# Patient Record
Sex: Female | Born: 1980 | Race: Black or African American | Hispanic: No | Marital: Single | Smoking: Current some day smoker
Health system: Southern US, Community
[De-identification: ages and names within clinical notes are randomized; demographics above are authoritative.]

## PROBLEM LIST (undated history)

## (undated) DIAGNOSIS — J45909 Unspecified asthma, uncomplicated: Secondary | ICD-10-CM

## (undated) DIAGNOSIS — Z789 Other specified health status: Secondary | ICD-10-CM

## (undated) HISTORY — PX: NO PAST SURGERIES: SHX2092

---
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
## 2003-07-06 ENCOUNTER — Encounter: Payer: Self-pay | Admitting: Emergency Medicine

## 2003-07-06 ENCOUNTER — Emergency Department (HOSPITAL_COMMUNITY): Admission: EM | Admit: 2003-07-06 | Discharge: 2003-07-06 | Payer: Self-pay | Admitting: Emergency Medicine

## 2003-12-30 ENCOUNTER — Inpatient Hospital Stay (HOSPITAL_COMMUNITY): Admission: AD | Admit: 2003-12-30 | Discharge: 2003-12-30 | Payer: Self-pay | Admitting: Obstetrics & Gynecology

## 2003-12-31 ENCOUNTER — Inpatient Hospital Stay (HOSPITAL_COMMUNITY): Admission: AD | Admit: 2003-12-31 | Discharge: 2004-01-04 | Payer: Self-pay | Admitting: *Deleted

## 2007-03-12 ENCOUNTER — Inpatient Hospital Stay (HOSPITAL_COMMUNITY): Admission: AD | Admit: 2007-03-12 | Discharge: 2007-03-12 | Payer: Self-pay | Admitting: Obstetrics and Gynecology

## 2007-03-12 ENCOUNTER — Ambulatory Visit: Payer: Self-pay | Admitting: *Deleted

## 2008-03-29 ENCOUNTER — Inpatient Hospital Stay (HOSPITAL_COMMUNITY): Admission: AD | Admit: 2008-03-29 | Discharge: 2008-03-31 | Payer: Self-pay | Admitting: Obstetrics & Gynecology

## 2008-03-29 ENCOUNTER — Ambulatory Visit: Payer: Self-pay | Admitting: Family

## 2008-06-30 ENCOUNTER — Emergency Department (HOSPITAL_COMMUNITY): Admission: EM | Admit: 2008-06-30 | Discharge: 2008-06-30 | Payer: Self-pay | Admitting: Emergency Medicine

## 2008-08-18 ENCOUNTER — Inpatient Hospital Stay (HOSPITAL_COMMUNITY): Admission: AD | Admit: 2008-08-18 | Discharge: 2008-08-18 | Payer: Self-pay | Admitting: Obstetrics & Gynecology

## 2011-04-30 NOTE — Discharge Summary (Signed)
NAME:  Rebekah Evans, Rebekah Evans                         ACCOUNT NO.:  [id number]   MEDICAL RECORD NO.:  [id number]                   PATIENT TYPE:  INP   LOCATION:  9130                                 FACILITY:  [facility]   PHYSICIAN:  Conni Elliot, M.D.             DATE OF BIRTH:  [birth year]   DATE OF ADMISSION:  12/31/2003  DATE OF DISCHARGE:  01/04/2004                                 DISCHARGE SUMMARY   DISCHARGE DIAGNOSES:  1. Pregnancy uterine, delivered, term cephalic live birth.  2. Urinary tract infection.   DISCHARGE MEDICATIONS:  1. Ibuprofen 600 mg p.o. every 6 hours for pain.  2. Macrodantin 100 mg p.o. b.i.d. for 5 more days.  3. Doxycycline 100 mg p.o. b.i.d. for 5 more days.   DISCHARGE INSTRUCTIONS:  1. Diet:  Regular.  2. Activity:  Nothing in the vagina for 6 weeks.  3. Follow up at [facility] after 6 weeks.   REVIEW OF HISTORY:  This is a 30 year old G 2, P 1-0-0-1 who presented at 39  weeks and 1 day because of ruptured bag of waters associated with  contractions.  The patient had stable vital signs and a blood pressure of  132/73, temperature of 97.3, pulse rate of 85, respiratory rate of 20.  Essentially normal physical exam findings.  On speculum examination, there  was pooling of amniotic fluid in the posterior vagina.  Nitrazine test was  positive.  The cervix was 4 cm dilated, 75% effaced, station minus 2.  No  bag of waters palpated.  Vertex presentation.  Fetal heart rate tracing  showed the baseline rate of 125 to 130 with average variability with  accelerations and an occasional variable deceleration.  Her contractions  were coming every 6 minutes, 50 seconds duration, moderate intensity.   HOSPITAL COURSE:  The patient delivered a live baby boy with Apgar score of  9/9 at 3:05 p.m.  Placenta was delivered at 3:10 p.m.  There was note of  second-degree laceration at the posterior vaginal introitus which was  repaired with 3-0 Vicryl.  Estimated blood  loss of 500 mL.  The patient's  postpartum course was complicated by fever on the first postpartum day as  high as 101.1.  Laboratory workup was done to determine the focus of  infection.  Urine cultures showed more than 100,000 colonies of E. coli.  The patient was started on Macrodantin 100 mg p.o. two times a day and  doxycycline for possible endometritis.  The patient was further observed and  became afebrile more than 24 hours prior to discharge.   CONDITION ON DISCHARGE:  The patient was discharged with no complaints.  The  patient had been afebrile for more than 24 hours.  Blood pressure was  112/72, pulse rate of 78, respiratory rate of 20, temperature was 99.3.  The  uterus was well contracted at the level just below the umbilicus.  There  were no nodes or uterine tenderness.  The patient is advised to continue her  antibiotics at home and to follow up in 6 weeks at [facility].     Lawerance Sabal, MD                         Conni Elliot, M.D.   MC/MEDQ  D:  01/04/2004  T:  01/04/2004  Job:  [id number]

## 2011-09-07 LAB — HEPATITIS B SURFACE ANTIGEN: Hepatitis B Surface Ag: NEGATIVE

## 2011-09-07 LAB — CBC
Hemoglobin: 12.8
MCHC: 34.2
Platelets: 209
Platelets: 219
RBC: 4.34
RDW: 13.1
RDW: 13.4
WBC: 13.6 — ABNORMAL HIGH

## 2011-09-07 LAB — TYPE AND SCREEN: Antibody Screen: NEGATIVE

## 2011-09-07 LAB — ABO/RH: ABO/RH(D): O POS

## 2011-09-15 LAB — GC/CHLAMYDIA PROBE AMP, GENITAL
Chlamydia, DNA Probe: NEGATIVE
GC Probe Amp, Genital: POSITIVE — AB

## 2011-09-15 LAB — WET PREP, GENITAL
Trich, Wet Prep: NONE SEEN
Yeast Wet Prep HPF POC: NONE SEEN

## 2016-10-07 ENCOUNTER — Encounter (HOSPITAL_COMMUNITY): Payer: Self-pay | Admitting: *Deleted

## 2016-10-07 ENCOUNTER — Inpatient Hospital Stay (HOSPITAL_COMMUNITY)
Admission: AD | Admit: 2016-10-07 | Discharge: 2016-10-07 | Disposition: A | Discharge status: Home / Self Care | Payer: Self-pay | Source: Ambulatory Visit | Attending: Obstetrics & Gynecology | Admitting: Obstetrics & Gynecology

## 2016-10-07 DIAGNOSIS — O200 Threatened abortion: Secondary | ICD-10-CM

## 2016-10-07 DIAGNOSIS — O209 Hemorrhage in early pregnancy, unspecified: Secondary | ICD-10-CM

## 2016-10-07 DIAGNOSIS — O039 Complete or unspecified spontaneous abortion without complication: Secondary | ICD-10-CM | POA: Insufficient documentation

## 2016-10-07 DIAGNOSIS — F17210 Nicotine dependence, cigarettes, uncomplicated: Secondary | ICD-10-CM | POA: Insufficient documentation

## 2016-10-07 HISTORY — DX: Other specified health status: Z78.9

## 2016-10-07 LAB — URINALYSIS, ROUTINE W REFLEX MICROSCOPIC
BILIRUBIN URINE: NEGATIVE
Glucose, UA: NEGATIVE mg/dL
KETONES UR: NEGATIVE mg/dL
LEUKOCYTES UA: NEGATIVE
NITRITE: NEGATIVE
PH: 6 (ref 5.0–8.0)
PROTEIN: NEGATIVE mg/dL
Specific Gravity, Urine: 1.02 (ref 1.005–1.030)

## 2016-10-07 LAB — CBC WITH DIFFERENTIAL/PLATELET
BASOS ABS: 0 10*3/uL (ref 0.0–0.1)
Basophils Relative: 1 %
EOS PCT: 2 %
Eosinophils Absolute: 0.2 10*3/uL (ref 0.0–0.7)
HEMATOCRIT: 38.2 % (ref 36.0–46.0)
Hemoglobin: 12.7 g/dL (ref 12.0–15.0)
LYMPHS ABS: 3.3 10*3/uL (ref 0.7–4.0)
LYMPHS PCT: 38 %
MCH: 28.4 pg (ref 26.0–34.0)
MCHC: 33.2 g/dL (ref 30.0–36.0)
MCV: 85.5 fL (ref 78.0–100.0)
MONO ABS: 0.4 10*3/uL (ref 0.1–1.0)
MONOS PCT: 5 %
NEUTROS ABS: 4.8 10*3/uL (ref 1.7–7.7)
Neutrophils Relative %: 54 %
PLATELETS: 307 10*3/uL (ref 150–400)
RBC: 4.47 MIL/uL (ref 3.87–5.11)
RDW: 14.8 % (ref 11.5–15.5)
WBC: 8.8 10*3/uL (ref 4.0–10.5)

## 2016-10-07 LAB — WET PREP, GENITAL
Sperm: NONE SEEN
Trich, Wet Prep: NONE SEEN
Yeast Wet Prep HPF POC: NONE SEEN

## 2016-10-07 LAB — URINE MICROSCOPIC-ADD ON
Bacteria, UA: NONE SEEN
WBC UA: NONE SEEN WBC/hpf (ref 0–5)

## 2016-10-07 LAB — POCT PREGNANCY, URINE: PREG TEST UR: NEGATIVE

## 2016-10-07 LAB — HCG, QUANTITATIVE, PREGNANCY: HCG, BETA CHAIN, QUANT, S: 25 m[IU]/mL — AB (ref <5)

## 2016-10-07 MED ORDER — KETOROLAC TROMETHAMINE 60 MG/2ML IM SOLN
60.0000 mg | Freq: Once | INTRAMUSCULAR | Status: AC
  Administered 2016-10-07: 60 mg via INTRAMUSCULAR
  Filled 2016-10-07: qty 2

## 2016-10-07 MED ORDER — OXYCODONE-ACETAMINOPHEN 5-325 MG PO TABS: 1.0000 | ORAL_TABLET | Freq: Four times a day (QID) | ORAL | 0 refills | Status: DC | PRN

## 2016-10-07 NOTE — MAU Provider Note (Signed)
Chief Complaint: Vaginal Bleeding and Abdominal Cramping   First Provider Initiated Contact with Patient 10/07/16 1621        SUBJECTIVE HPI: Rebekah Evans is a 35 y.o. G5P0 at Unknown by LMP who presents to maternity admissions reporting heavy bleeding about 5-6 days ago. Had a positive UPT 3 weeks ago.  Does not want to be pregnant, states it would be terrible. She denies vaginal itching/burning, urinary symptoms, h/a, dizziness, n/v, or fever/chills.    Vaginal Bleeding  The patient's primary symptoms include pelvic pain and vaginal bleeding. The patient's pertinent negatives include no genital itching, genital lesions or genital odor. This is a recurrent problem. The current episode started in the past 7 days. The problem occurs intermittently. The problem has been waxing and waning. The pain is moderate. The problem affects both sides. She is pregnant. Associated symptoms include abdominal pain. Pertinent negatives include no back pain, chills, constipation, diarrhea, dysuria, fever, nausea or vomiting. The vaginal discharge was bloody. The vaginal bleeding is lighter than menses. She has been passing clots. She has not been passing tissue. Nothing aggravates the symptoms. She has tried nothing for the symptoms.   RN Note: Pt reports she has had Positive UPT 3 weeks ago. Started having some cramping on Sunday and then started  Having vaginal bleeding on Monday. Still bleeding tosy reports having some large clots come out.  Past Medical History:  Diagnosis Date  . Medical history non-contributory    Past Surgical History:  Procedure Laterality Date  . NO PAST SURGERIES     Social History   Social History  . Marital status: Single    Spouse name: N/A  . Number of children: N/A  . Years of education: N/A   Occupational History  . Not on file.   Social History Main Topics  . Smoking status: Current Every Day Smoker    Packs/day: 0.50  . Smokeless tobacco: Current User  .  Alcohol use Yes     Comment: Social  . Drug use: No  . Sexual activity: Yes   Other Topics Concern  . Not on file   Social History Narrative  . No narrative on file   No current facility-administered medications on file prior to encounter.    No current outpatient prescriptions on file prior to encounter.   Allergies not on file  I have reviewed patient's Past Medical Hx, Surgical Hx, Family Hx, Social Hx, medications and allergies.   ROS:  Review of Systems  Constitutional: Negative for chills and fever.  Gastrointestinal: Positive for abdominal pain. Negative for constipation, diarrhea, nausea and vomiting.  Genitourinary: Positive for pelvic pain and vaginal bleeding. Negative for dysuria.  Musculoskeletal: Negative for back pain.  Neurological: Negative for dizziness.   Review of Systems  Other systems negative   Physical Exam  Patient Vitals for the past 24 hrs:  BP Temp Temp src Pulse Resp Height Weight  10/07/16 1451 129/84 98.3 F (36.8 C) Oral 85 18 5\' 8"  (1.727 m) (!) 306 lb (138.8 kg)    Physical Exam  Constitutional: Well-developed, well-nourished female in no acute distress.  Cardiovascular: normal rate Respiratory: normal effort GI: Abd soft, non-tender. Pos BS x 4 MS: Extremities nontender, no edema, normal ROM Neurologic: Alert and oriented x 4.  GU: Neg CVAT.  PELVIC EXAM: Cervix pink, visually closed, without lesion, scant bloody discharge, vaginal walls and external genitalia normal Bimanual exam: Cervix 0/long/high, firm, anterior, neg CMT, uterus tender, nonenlarged, adnexa without tenderness, enlargement,  or mass   LAB RESULTS Results for orders placed or performed during the hospital encounter of 10/07/16 (from the past 24 hour(s))  Urinalysis, Routine w reflex microscopic (not at [facility])     Status: Abnormal   Collection Time: 10/07/16  2:55 PM  Result Value Ref Range   Color, Urine YELLOW YELLOW   APPearance HAZY (A) CLEAR   Specific  Gravity, Urine 1.020 1.005 - 1.030   pH 6.0 5.0 - 8.0   Glucose, UA NEGATIVE NEGATIVE mg/dL   Hgb urine dipstick LARGE (A) NEGATIVE   Bilirubin Urine NEGATIVE NEGATIVE   Ketones, ur NEGATIVE NEGATIVE mg/dL   Protein, ur NEGATIVE NEGATIVE mg/dL   Nitrite NEGATIVE NEGATIVE   Leukocytes, UA NEGATIVE NEGATIVE  Urine microscopic-add on     Status: Abnormal   Collection Time: 10/07/16  2:55 PM  Result Value Ref Range   Squamous Epithelial / LPF 0-5 (A) NONE SEEN   WBC, UA NONE SEEN 0 - 5 WBC/hpf   RBC / HPF 6-30 0 - 5 RBC/hpf   Bacteria, UA NONE SEEN NONE SEEN   Urine-Other MICROSCOPIC EXAM PERFORMED ON UNCONCENTRATED URINE   Pregnancy, urine POC     Status: None   Collection Time: 10/07/16  3:51 PM  Result Value Ref Range   Preg Test, Ur NEGATIVE NEGATIVE  CBC with Differential/Platelet     Status: None   Collection Time: 10/07/16  4:17 PM  Result Value Ref Range   WBC 8.8 4.0 - 10.5 K/uL   RBC 4.47 3.87 - 5.11 MIL/uL   Hemoglobin 12.7 12.0 - 15.0 g/dL   HCT 16.1 09.6 - 04.5 %   MCV 85.5 78.0 - 100.0 fL   MCH 28.4 26.0 - 34.0 pg   MCHC 33.2 30.0 - 36.0 g/dL   RDW 40.9 81.1 - 91.4 %   Platelets 307 150 - 400 K/uL   Neutrophils Relative % 54 %   Neutro Abs 4.8 1.7 - 7.7 K/uL   Lymphocytes Relative 38 %   Lymphs Abs 3.3 0.7 - 4.0 K/uL   Monocytes Relative 5 %   Monocytes Absolute 0.4 0.1 - 1.0 K/uL   Eosinophils Relative 2 %   Eosinophils Absolute 0.2 0.0 - 0.7 K/uL   Basophils Relative 1 %   Basophils Absolute 0.0 0.0 - 0.1 K/uL  hCG, quantitative, pregnancy     Status: Abnormal   Collection Time: 10/07/16  4:17 PM  Result Value Ref Range   hCG, Beta Chain, Quant, S 25 (H) <5 mIU/mL  Wet prep, genital     Status: Abnormal   Collection Time: 10/07/16  4:27 PM  Result Value Ref Range   Yeast Wet Prep HPF POC NONE SEEN NONE SEEN   Trich, Wet Prep NONE SEEN NONE SEEN   Clue Cells Wet Prep HPF POC PRESENT (A) NONE SEEN   WBC, Wet Prep HPF POC FEW (A) NONE SEEN   Sperm NONE  SEEN        IMAGING No results found.  MAU Management/MDM: Ordered usual first trimester r/o ectopic labs.   Pelvic exam and cultures done Will check baseline Ultrasound to rule out ectopic.  Consult Dr Alysia Penna with presentation, exam findings, and results.  Since Quant is only 25 and UPT was positive 3 weeks ago, this is likely the end of the miscarriage.  No need to do an [place] today. Will recheck HCG on Saturday.  If it rises, then do an [place]. .   This bleeding/pain can represent a  normal pregnancy with bleeding, spontaneous abortion or even an ectopic which can be life-threatening.  The process as listed above helps to determine which of these is present.    ASSESSMENT Probable incomplete SAB  PLAN Discharge home Plan to repeat HCG level in 48 hours in MAU Saturday evening  Pt stable at time of discharge. Encouraged to return here or to other Urgent Care/ED if she develops worsening of symptoms, increase in pain, fever, or other concerning symptoms.    Wynelle BourgeoisMarie Horst Ostermiller CNM, MSN Certified Nurse-Midwife 10/07/2016  6:41 PM

## 2016-10-07 NOTE — Discharge Instructions (Signed)
Threatened Miscarriage A threatened miscarriage occurs when you have vaginal bleeding during your first 20 weeks of pregnancy but the pregnancy has not ended. If you have vaginal bleeding during this time, your health care provider will do tests to make sure you are still pregnant. If the tests show you are still pregnant and the developing baby (fetus) inside your womb (uterus) is still growing, your condition is considered a threatened miscarriage. A threatened miscarriage does not mean your pregnancy will end, but it does increase the risk of losing your pregnancy (complete miscarriage). CAUSES  The cause of a threatened miscarriage is usually not known. If you go on to have a complete miscarriage, the most common cause is an abnormal number of chromosomes in the developing baby. Chromosomes are the structures inside cells that hold all your genetic material. Some causes of vaginal bleeding that do not result in miscarriage include:  Having sex.  Having an infection.  Normal hormone changes of pregnancy.  Bleeding that occurs when an egg implants in your uterus. RISK FACTORS Risk factors for bleeding in early pregnancy include:  Obesity.  Smoking.  Drinking excessive amounts of alcohol or caffeine.  Recreational drug use. SIGNS AND SYMPTOMS  Light vaginal bleeding.  Mild abdominal pain or cramps. DIAGNOSIS  If you have bleeding with or without abdominal pain before 20 weeks of pregnancy, your health care provider will do tests to check whether you are still pregnant. One important test involves using sound waves and a computer (ultrasound) to create images of the inside of your uterus. Other tests include an internal exam of your vagina and uterus (pelvic exam) and measurement of your baby's heart rate.  You may be diagnosed with a threatened miscarriage if:  Ultrasound testing shows you are still pregnant.  Your baby's heart rate is strong.  A pelvic exam shows that the  opening between your uterus and your vagina (cervix) is closed.  Your heart rate and blood pressure are stable.  Blood tests confirm you are still pregnant. TREATMENT  No treatments have been shown to prevent a threatened miscarriage from going on to a complete miscarriage. However, the right home care is important.  HOME CARE INSTRUCTIONS   Make sure you keep all your appointments for prenatal care. This is very important.  Get plenty of rest.  Do not have sex or use tampons if you have vaginal bleeding.  Do not douche.  Do not smoke or use recreational drugs.  Do not drink alcohol.  Avoid caffeine. SEEK MEDICAL CARE IF:  You have light vaginal bleeding or spotting while pregnant.  You have abdominal pain or cramping.  You have a fever. SEEK IMMEDIATE MEDICAL CARE IF:  You have heavy vaginal bleeding.  You have blood clots coming from your vagina.  You have severe low back pain or abdominal cramps.  You have fever, chills, and severe abdominal pain. MAKE SURE YOU:  Understand these instructions.  Will watch your condition.  Will get help right away if you are not doing well or get worse.   This information is not intended to replace advice given to you by your health care provider. Make sure you discuss any questions you have with your health care provider.   Document Released: 11/29/2005 Document Revised: 12/04/2013 Document Reviewed: 09/25/2013 Elsevier Interactive Patient Education 2016 Elsevier Inc.  Pelvic Rest Pelvic rest is sometimes recommended for women when:   The placenta is partially or completely covering the opening of the cervix (placenta previa).  is bleeding between the uterine wall and the amniotic sac in the first trimester (subchorionic hemorrhage). °· The cervix begins to open without labor starting (incompetent cervix, cervical insufficiency). °· The labor is too early (preterm labor). °HOME CARE INSTRUCTIONS °· Do not have sexual  intercourse, stimulation, or an orgasm. °· Do not use tampons, douche, or put anything in the vagina. °· Do not lift anything over 10 pounds (4.5 kg). °· Avoid strenuous activity or straining your pelvic muscles. °SEEK MEDICAL CARE IF:  °· You have any vaginal bleeding during pregnancy. Treat this as a potential emergency. °· You have cramping pain felt low in the stomach (stronger than menstrual cramps). °· You notice vaginal discharge (watery, mucus, or bloody). °· You have a low, dull backache. °· There are regular contractions or uterine tightening. °SEEK IMMEDIATE MEDICAL CARE IF: °You have vaginal bleeding and have placenta previa.  °  °This information is not intended to replace advice given to you by your health care provider. Make sure you discuss any questions you have with your health care provider. °  °Document Released: 03/26/2011 Document Revised: 02/21/2012 Document Reviewed: 06/02/2015 °Elsevier Interactive Patient Education ©2016 Elsevier Inc. ° °

## 2016-10-07 NOTE — MAU Note (Signed)
Pt reports she has had Positive UPT 3 weeks ago. Started having some cramping on Sunday and then started  Having vaginal bleeding on Monday. Still bleeding tosy reports having some large clots come out.

## 2016-10-08 LAB — GC/CHLAMYDIA PROBE AMP (~~LOC~~) NOT AT ARMC
CHLAMYDIA, DNA PROBE: NEGATIVE
Neisseria Gonorrhea: NEGATIVE

## 2016-10-09 ENCOUNTER — Inpatient Hospital Stay (HOSPITAL_COMMUNITY)
Admission: AD | Admit: 2016-10-09 | Discharge: 2016-10-09 | Disposition: A | Discharge status: Home / Self Care | Payer: Self-pay | Source: Ambulatory Visit | Attending: Obstetrics & Gynecology | Admitting: Obstetrics & Gynecology

## 2016-10-09 DIAGNOSIS — O039 Complete or unspecified spontaneous abortion without complication: Secondary | ICD-10-CM

## 2016-10-09 DIAGNOSIS — Z3A00 Weeks of gestation of pregnancy not specified: Secondary | ICD-10-CM | POA: Insufficient documentation

## 2016-10-09 DIAGNOSIS — O209 Hemorrhage in early pregnancy, unspecified: Secondary | ICD-10-CM | POA: Insufficient documentation

## 2016-10-09 LAB — HCG, QUANTITATIVE, PREGNANCY: hCG, Beta Chain, Quant, S: 15 m[IU]/mL — ABNORMAL HIGH (ref <5)

## 2016-10-09 NOTE — MAU Provider Note (Signed)
History   Chief Complaint:  Follow-up   Rebekah Evans is  35 y.o. G5P0 Patient's last menstrual period was 08/14/2016.Marland Kitchen. Patient is here for follow up of quantitative HCG and ongoing surveillance of pregnancy status.   She is Unknown weeks gestation and does not want to be pregnant.  (Condom broke)  Since her last visit, the patient is without new complaint.   The patient reports bleeding as less than previously. General ROS - no additional complaints  Her previous Quantitative HCG values are: 25 on 10-07-16.     Physical Exam   Blood pressure 130/65, pulse 90, temperature 98.6 F (37 C), temperature source Oral, resp. rate 18, last menstrual period 08/14/2016.  Focused Gynecological Exam:  GENERAL: Well-developed, well-nourished female in no acute distress.  HEENT: Normocephalic, atraumatic.  LUNGS: Effort normal  HEART: Regular rate  SKIN: Warm, dry and without erythema  PSYCH: Normal mood and affect   Labs: Results for orders placed or performed during the hospital encounter of 10/09/16 (from the past 24 hour(s))  hCG, quantitative, pregnancy   Collection Time: 10/09/16  4:45 PM  Result Value Ref Range   hCG, Beta Chain, Quant, S 15 (H) <5 mIU/mL    Ultrasound Studies:   No results found.  Assessment:  Falling quants consistent with spontaneous AB   Plan: The patient is instructed to follow up in on Monday, November 56 at 11 [facility] To return here if develop severe lower abdominal pain or severe vaginal bleeding.  Rebekah Evans L Rebekah Evans 10/09/2016, 6:05 PM

## 2016-10-09 NOTE — MAU Note (Signed)
Bleeding has gotten heavier, soaking pads. Is not hurting as much. Bleeding increased last night.

## 2016-10-18 ENCOUNTER — Ambulatory Visit: Payer: Self-pay

## 2016-10-18 ENCOUNTER — Telehealth: Payer: Self-pay

## 2016-10-18 NOTE — Telephone Encounter (Signed)
Patient was down for STAT beta today but miss her appointment. Called patient spoke with her mother who stated mom was not available to talk at this time.  Per mother will give her the message to call us back tomorrow to reschedule her appointment.

## 2016-11-18 ENCOUNTER — Encounter (HOSPITAL_COMMUNITY): Payer: Self-pay | Admitting: *Deleted

## 2016-11-18 ENCOUNTER — Emergency Department (HOSPITAL_COMMUNITY)
Admission: EM | Admit: 2016-11-18 | Discharge: 2016-11-18 | Disposition: A | Discharge status: Home / Self Care | Payer: Medicaid Other | Attending: Emergency Medicine | Admitting: Emergency Medicine

## 2016-11-18 DIAGNOSIS — L959 Vasculitis limited to the skin, unspecified: Secondary | ICD-10-CM | POA: Insufficient documentation

## 2016-11-18 DIAGNOSIS — R21 Rash and other nonspecific skin eruption: Secondary | ICD-10-CM | POA: Diagnosis present

## 2016-11-18 DIAGNOSIS — F17200 Nicotine dependence, unspecified, uncomplicated: Secondary | ICD-10-CM | POA: Diagnosis not present

## 2016-11-18 DIAGNOSIS — I776 Arteritis, unspecified: Secondary | ICD-10-CM

## 2016-11-18 LAB — COMPREHENSIVE METABOLIC PANEL
ALBUMIN: 3.3 g/dL — AB (ref 3.5–5.0)
ALK PHOS: 70 U/L (ref 38–126)
ALT: 16 U/L (ref 14–54)
ANION GAP: 7 (ref 5–15)
AST: 20 U/L (ref 15–41)
BILIRUBIN TOTAL: 0.2 mg/dL — AB (ref 0.3–1.2)
BUN: 11 mg/dL (ref 6–20)
CALCIUM: 8.8 mg/dL — AB (ref 8.9–10.3)
CO2: 23 mmol/L (ref 22–32)
CREATININE: 0.93 mg/dL (ref 0.44–1.00)
Chloride: 106 mmol/L (ref 101–111)
GFR calc Af Amer: >60 mL/min (ref >60)
GFR calc non Af Amer: >60 mL/min (ref >60)
GLUCOSE: 104 mg/dL — AB (ref 65–99)
Potassium: 3.5 mmol/L (ref 3.5–5.1)
Sodium: 136 mmol/L (ref 135–145)
TOTAL PROTEIN: 6.6 g/dL (ref 6.5–8.1)

## 2016-11-18 LAB — CBC
HEMATOCRIT: 37.5 % (ref 36.0–46.0)
HEMOGLOBIN: 12.1 g/dL (ref 12.0–15.0)
MCH: 26.9 pg (ref 26.0–34.0)
MCHC: 32.3 g/dL (ref 30.0–36.0)
MCV: 83.3 fL (ref 78.0–100.0)
Platelets: 307 10*3/uL (ref 150–400)
RBC: 4.5 MIL/uL (ref 3.87–5.11)
RDW: 14.6 % (ref 11.5–15.5)
WBC: 9.4 10*3/uL (ref 4.0–10.5)

## 2016-11-18 MED ORDER — PREDNISONE 20 MG PO TABS: 60.0000 mg | ORAL_TABLET | Freq: Every day | ORAL | 0 refills | Status: AC

## 2016-11-18 MED ORDER — OXYCODONE-ACETAMINOPHEN 5-325 MG PO TABS
1.0000 | ORAL_TABLET | Freq: Once | ORAL | Status: AC
  Administered 2016-11-18: 1 via ORAL
  Filled 2016-11-18: qty 1

## 2016-11-18 MED ORDER — PREDNISONE 20 MG PO TABS
60.0000 mg | ORAL_TABLET | Freq: Once | ORAL | Status: AC
  Administered 2016-11-18: 60 mg via ORAL
  Filled 2016-11-18: qty 3

## 2016-11-18 MED ORDER — OXYCODONE-ACETAMINOPHEN 5-325 MG PO TABS: 1.0000 | ORAL_TABLET | Freq: Three times a day (TID) | ORAL | 0 refills | Status: DC | PRN

## 2016-11-18 NOTE — ED Notes (Signed)
Crackers given, pt has not eaten yet today.

## 2016-11-18 NOTE — ED Triage Notes (Signed)
Pt states red, painful swollen rash to both LE.  States intermittent edema to both LE.  Denies sob, chest pain, etc.

## 2016-11-18 NOTE — Discharge Instructions (Signed)
Please read and follow all provided instructions.  Your diagnoses today include:  1. Vasculitis (HCC)    Tests performed today include: Vital signs. See below for your results today.   Medications prescribed:  Take as prescribed   Home care instructions:  Follow any educational materials contained in this packet.  Follow-up instructions: Please follow-up with Rheumatology for further evaluation of symptoms and treatment   Return instructions:  Please return to the Emergency Department if you do not get better, if you get worse, or new symptoms OR  - Fever (temperature greater than 101.19F)  - Bleeding that does not stop with holding pressure to the area    -Severe pain (please note that you may be more sore the day after your accident)  - Chest Pain  - Difficulty breathing  - Severe nausea or vomiting  - Inability to tolerate food and liquids  - Passing out  - Skin becoming red around your wounds  - Change in mental status (confusion or lethargy)  - New numbness or weakness    Please return if you have any other emergent concerns.  Additional Information:  Your vital signs today were: BP 136/77 (BP Location: Left Arm)    Pulse 86    Temp 97.8 F (36.6 C) (Oral)    Resp 19    Ht 5\' 8"  (1.727 m)    Wt 131.5 kg    LMP 10/13/2016    SpO2 96%    BMI 44.09 kg/m  If your blood pressure (BP) was elevated above 135/85 this visit, please have this repeated by your doctor within one month. ---------------

## 2016-11-18 NOTE — ED Notes (Signed)
Pt and child in room. Introduced myself to them and advised them that RN would be in shortly.

## 2016-11-18 NOTE — ED Provider Notes (Signed)
[facility]-EMERGENCY DEPT Provider Note   CSN: [id number] Arrival date & time: 11/18/16  1145  By signing my name below, I, Sonum Patel, attest that this documentation has been prepared under the direction and in the presence of Audry Piliyler Indya Oliveria, PA-C. Electronically Signed: Sonum Patel, Neurosurgeoncribe. 11/18/16. 12:05 PM.  History   Chief Complaint Chief Complaint  Patient presents with  . Leg Swelling  . Rash  . Leg Pain    The history is provided by the patient. No language interpreter was used.     HPI Comments: Rebekah Evans is a 35 y.o. female who presents to the Emergency Department complaining of bruising and pain to the bilateral lower extremities for the past few days. She denies any known trauma to injury to the affected area. She states the bruising appears randomly on both legs and start to fade after 1-2 days. She reports associated swelling to the affected areas. She denies new medications, detergents, or body products. She denies recent long distance travel. She denies fever. No URI symptoms recently. No CP/SOb/ABD pain. No other symptoms noted.   Past Medical History:  Diagnosis Date  . Medical history non-contributory     There are no active problems to display for this patient.   Past Surgical History:  Procedure Laterality Date  . NO PAST SURGERIES      OB History    Gravida Para Term Preterm AB Living   5         5   SAB TAB Ectopic Multiple Live Births                   Home Medications    Prior to Admission medications   Medication Sig Start Date End Date Taking? Authorizing Provider  oxyCODONE-acetaminophen (PERCOCET/ROXICET) 5-325 MG tablet Take 1-2 tablets by mouth every 6 (six) hours as needed. 10/07/16   Aviva SignsMarie L Williams, CNM    Family History No family history on file.  Social History Social History  Substance Use Topics  . Smoking status: Current Every Day Smoker    Packs/day: 0.50  . Smokeless tobacco: Current User  . Alcohol use Yes   Comment: Social    Allergies   Patient has no known allergies.   Review of Systems Review of Systems  Constitutional: Negative for fever.  HENT: Negative for congestion, sinus pain, sinus pressure and sore throat.   Respiratory: Negative for cough and shortness of breath.   Gastrointestinal: Negative for diarrhea, nausea and vomiting.  Skin: Positive for color change.  Allergic/Immunologic: Negative for immunocompromised state.  Neurological: Negative for numbness.  Hematological: Does not bruise/bleed easily.   Physical Exam Updated Vital Signs BP 136/77 (BP Location: Left Arm)   Pulse 86   Temp 97.8 F (36.6 C) (Oral)   Resp 19   Ht 5\' 8"  (1.727 m)   Wt 290 lb (131.5 kg)   LMP 10/13/2016   SpO2 96%   BMI 44.09 kg/m   Physical Exam  Constitutional: She is oriented to person, place, and time. Vital signs are normal. She appears well-developed and well-nourished.  HENT:  Head: Normocephalic and atraumatic.  Right Ear: Hearing normal.  Left Ear: Hearing normal.  Eyes: Conjunctivae and EOM are normal. Pupils are equal, round, and reactive to light.  Neck: Normal range of motion. Neck supple.  Cardiovascular: Normal rate, regular rhythm, normal heart sounds and intact distal pulses.   Pulmonary/Chest: Effort normal and breath sounds normal.  Musculoskeletal: Normal range of motion.  Neurological: She  is alert and oriented to person, place, and time.  Skin: Skin is warm and dry.  BLE with macular rashes noted. Scattered with sized between 3-4cm. No blanching. Areas are faint. No signs of infection. Appears almost ecchymotic with stages of healing noted on several areas. TTP on areas. Otherwise NVI. Motor/sensation intact.   Psychiatric: She has a normal mood and affect. Her speech is normal and behavior is normal. Thought content normal.  Nursing note and vitals reviewed.  ED Treatments / Results  DIAGNOSTIC STUDIES: Oxygen Saturation is 96% on RA, adequate by my  interpretation.    COORDINATION OF CARE: 12:06 PM Discussed treatment plan with pt at bedside and pt agreed to plan.   Labs (all labs ordered are listed, but only abnormal results are displayed) Labs Reviewed - No data to display  EKG  EKG Interpretation None       Radiology No results found.  Procedures Procedures (including critical care time)  Medications Ordered in ED Medications - No data to display   Initial Impression / Assessment and Plan / ED Course  I have reviewed the triage vital signs and the nursing notes.  Pertinent labs & imaging results that were available during my care of the patient were reviewed by me and considered in my medical decision making (see chart for details).  Clinical Course    Final Clinical Impressions(s) / ED Diagnoses     I have reviewed the relevant previous healthcare records.  I obtained HPI from historian. {Patient discussed with supervising physician  ED Course:  Assessment: Pt is a 35yF who presents with BLE rash noted with swelling. Has had swelling in past, but rash recently appeared a few days ago. No fevers. No URI symptoms. No coagulation disorders. On exam, pt in NAD. Nontoxic/nonseptic appearing. VSS. Afebrile. Lungs CTA. Heart RRR. BLE NVI. Motor/sensation intact. Rashes are macular and appear ecchymotic. Stages of bruising noted. No signs of infection. Discussed with supervising physician who has seen and evaluated patient. CBC unremarkable with platelets WNL. CMP with liver function unremarkable. Possible vasculitis. Given course of steroids. Follow up with Rheumatology. Plan is to DC home. At time of discharge, Patient is in no acute distress. Vital Signs are stable. Patient is able to ambulate. Patient able to tolerate PO.   Disposition/Plan:  DC Home Additional Verbal discharge instructions given and discussed with patient.  Pt Instructed to f/u with Rheumatology in the next week for evaluation and treatment of  symptoms. 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 The recorded information has been reviewed and is accurate.    Audry Piliyler Hazen Brumett, PA-C 11/18/16 1404    Gerhard Munchobert Lockwood, MD 11/18/16 1526    Gerhard Munchobert Lockwood, MD 11/18/16 [phone]

## 2018-04-23 ENCOUNTER — Emergency Department (HOSPITAL_COMMUNITY)
Admission: EM | Admit: 2018-04-23 | Discharge: 2018-04-23 | Disposition: A | Discharge status: Home / Self Care | Payer: No Typology Code available for payment source | Attending: Physician Assistant | Admitting: Physician Assistant

## 2018-04-23 DIAGNOSIS — Z23 Encounter for immunization: Secondary | ICD-10-CM | POA: Insufficient documentation

## 2018-04-23 DIAGNOSIS — F17200 Nicotine dependence, unspecified, uncomplicated: Secondary | ICD-10-CM | POA: Insufficient documentation

## 2018-04-23 DIAGNOSIS — T25221A Burn of second degree of right foot, initial encounter: Secondary | ICD-10-CM | POA: Diagnosis present

## 2018-04-23 DIAGNOSIS — Y929 Unspecified place or not applicable: Secondary | ICD-10-CM | POA: Diagnosis not present

## 2018-04-23 DIAGNOSIS — Y939 Activity, unspecified: Secondary | ICD-10-CM | POA: Insufficient documentation

## 2018-04-23 DIAGNOSIS — X101XXA Contact with hot food, initial encounter: Secondary | ICD-10-CM | POA: Diagnosis not present

## 2018-04-23 DIAGNOSIS — Y998 Other external cause status: Secondary | ICD-10-CM | POA: Insufficient documentation

## 2018-04-23 MED ORDER — IBUPROFEN 200 MG PO TABS
600.0000 mg | ORAL_TABLET | Freq: Once | ORAL | Status: AC
  Administered 2018-04-23: 600 mg via ORAL
  Filled 2018-04-23: qty 3

## 2018-04-23 MED ORDER — SILVER SULFADIAZINE 1 % EX CREA
TOPICAL_CREAM | Freq: Once | CUTANEOUS | Status: AC
  Administered 2018-04-23: 1 via TOPICAL
  Filled 2018-04-23: qty 50

## 2018-04-23 MED ORDER — TRAMADOL HCL 50 MG PO TABS
50.0000 mg | ORAL_TABLET | Freq: Once | ORAL | Status: AC
  Administered 2018-04-23: 50 mg via ORAL
  Filled 2018-04-23: qty 1

## 2018-04-23 MED ORDER — TRAMADOL HCL 50 MG PO TABS: 50.0000 mg | ORAL_TABLET | Freq: Four times a day (QID) | ORAL | 0 refills | Status: DC | PRN

## 2018-04-23 MED ORDER — TETANUS-DIPHTH-ACELL PERTUSSIS 5-2.5-18.5 LF-MCG/0.5 IM SUSP
0.5000 mL | Freq: Once | INTRAMUSCULAR | Status: AC
  Administered 2018-04-23: 0.5 mL via INTRAMUSCULAR
  Filled 2018-04-23: qty 0.5

## 2018-04-23 MED ORDER — NAPROXEN 500 MG PO TABS: 500.0000 mg | ORAL_TABLET | Freq: Two times a day (BID) | ORAL | 0 refills | Status: DC

## 2018-04-23 NOTE — ED Notes (Signed)
Bed: WTR9 Expected date:  Expected time:  Means of arrival:  Comments: 
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
## 2018-04-23 NOTE — Discharge Instructions (Signed)
Clean the wound and change the dressing every 12 hours. Follow up with the wound center for further treatment. Return here as needed.
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
## 2018-11-14 ENCOUNTER — Other Ambulatory Visit: Payer: Self-pay

## 2018-11-14 ENCOUNTER — Emergency Department (HOSPITAL_COMMUNITY): Payer: Self-pay

## 2018-11-14 ENCOUNTER — Inpatient Hospital Stay (HOSPITAL_COMMUNITY)
Admission: EM | Admit: 2018-11-14 | Discharge: 2018-11-18 | DRG: 202 | Disposition: A | Discharge status: Home / Self Care | Payer: Self-pay | Attending: Internal Medicine | Admitting: Internal Medicine

## 2018-11-14 ENCOUNTER — Encounter (HOSPITAL_COMMUNITY): Payer: Self-pay | Admitting: Emergency Medicine

## 2018-11-14 DIAGNOSIS — J209 Acute bronchitis, unspecified: Principal | ICD-10-CM | POA: Diagnosis present

## 2018-11-14 DIAGNOSIS — Z720 Tobacco use: Secondary | ICD-10-CM | POA: Diagnosis present

## 2018-11-14 DIAGNOSIS — Z833 Family history of diabetes mellitus: Secondary | ICD-10-CM

## 2018-11-14 DIAGNOSIS — Z79891 Long term (current) use of opiate analgesic: Secondary | ICD-10-CM

## 2018-11-14 DIAGNOSIS — F1290 Cannabis use, unspecified, uncomplicated: Secondary | ICD-10-CM | POA: Diagnosis present

## 2018-11-14 DIAGNOSIS — Z713 Dietary counseling and surveillance: Secondary | ICD-10-CM

## 2018-11-14 DIAGNOSIS — J40 Bronchitis, not specified as acute or chronic: Secondary | ICD-10-CM

## 2018-11-14 DIAGNOSIS — R0602 Shortness of breath: Secondary | ICD-10-CM

## 2018-11-14 DIAGNOSIS — Z6841 Body Mass Index (BMI) 40.0 and over, adult: Secondary | ICD-10-CM

## 2018-11-14 DIAGNOSIS — Z716 Tobacco abuse counseling: Secondary | ICD-10-CM

## 2018-11-14 DIAGNOSIS — J45901 Unspecified asthma with (acute) exacerbation: Secondary | ICD-10-CM | POA: Diagnosis present

## 2018-11-14 DIAGNOSIS — R739 Hyperglycemia, unspecified: Secondary | ICD-10-CM | POA: Diagnosis present

## 2018-11-14 DIAGNOSIS — F17210 Nicotine dependence, cigarettes, uncomplicated: Secondary | ICD-10-CM | POA: Diagnosis present

## 2018-11-14 DIAGNOSIS — R0902 Hypoxemia: Secondary | ICD-10-CM | POA: Diagnosis present

## 2018-11-14 DIAGNOSIS — Z8709 Personal history of other diseases of the respiratory system: Secondary | ICD-10-CM

## 2018-11-14 DIAGNOSIS — F1010 Alcohol abuse, uncomplicated: Secondary | ICD-10-CM | POA: Diagnosis present

## 2018-11-14 DIAGNOSIS — Z79899 Other long term (current) drug therapy: Secondary | ICD-10-CM

## 2018-11-14 MED ORDER — PREDNISONE 20 MG PO TABS
60.0000 mg | ORAL_TABLET | Freq: Once | ORAL | Status: AC
  Administered 2018-11-14: 60 mg via ORAL
  Filled 2018-11-14: qty 3

## 2018-11-14 MED ORDER — IPRATROPIUM-ALBUTEROL 0.5-2.5 (3) MG/3ML IN SOLN
3.0000 mL | Freq: Once | RESPIRATORY_TRACT | Status: AC
  Administered 2018-11-14: 3 mL via RESPIRATORY_TRACT
  Filled 2018-11-14: qty 3

## 2018-11-14 MED ORDER — ALBUTEROL SULFATE (2.5 MG/3ML) 0.083% IN NEBU
5.0000 mg | INHALATION_SOLUTION | Freq: Once | RESPIRATORY_TRACT | Status: AC
  Administered 2018-11-14: 5 mg via RESPIRATORY_TRACT
  Filled 2018-11-14: qty 6

## 2018-11-14 NOTE — ED Triage Notes (Signed)
Patient c/o cough, shortness of breath and body cramps since last night. Wheezing noted in triage. Patient reports she is anxious. Also c/o intermittent central chest pain with cough.

## 2018-11-14 NOTE — ED Provider Notes (Signed)
[facility]-EMERGENCY DEPT Provider Note   CSN: [id number] Arrival date & time: 11/14/18  2024     History   Chief Complaint Chief Complaint  Patient presents with  . Shortness of Breath  . Cough    HPI Rebekah Evans is a 37 y.o. female.  The history is provided by the patient.  Shortness of Breath  Associated symptoms include cough.  Cough  Associated symptoms include shortness of breath.  She comes in with onset last night of cough and difficulty breathing.  Cough is nonproductive.  When she goes into a coughing spasm, she gets cramps in her sides which seem to spread to her whole body.  She denies fever but has had chills and sweats.  Nothing makes symptoms better, nothing makes them worse.  She denies any sick contacts.  She does smoke 1/4 pack of cigarettes a day and also smokes marijuana.  She did receive an albuterol nebulizer treatment which seems to have given her some slight relief.  Past Medical History:  Diagnosis Date  . Medical history non-contributory     There are no active problems to display for this patient.   Past Surgical History:  Procedure Laterality Date  . NO PAST SURGERIES       OB History    Gravida  5   Para      Term      Preterm      AB      Living  5     SAB      TAB      Ectopic      Multiple      Live Births               Home Medications    Prior to Admission medications   Medication Sig Start Date End Date Taking? Authorizing Provider  naproxen (NAPROSYN) 500 MG tablet Take 1 tablet (500 mg total) by mouth 2 (two) times daily. 04/23/18   Janne Napoleon, NP  oxyCODONE-acetaminophen (PERCOCET/ROXICET) 5-325 MG tablet Take 1 tablet by mouth every 8 (eight) hours as needed for severe pain. 11/18/16   Audry Pili, PA-C  traMADol (ULTRAM) 50 MG tablet Take 1 tablet (50 mg total) by mouth every 6 (six) hours as needed. 04/23/18   Janne Napoleon, NP    Family History No family history on  file.  Social History Social History   Tobacco Use  . Smoking status: Current Every Day Smoker    Packs/day: 0.50  . Smokeless tobacco: Current User  Substance Use Topics  . Alcohol use: Yes    Comment: Social  . Drug use: No     Allergies   Patient has no known allergies.   Review of Systems Review of Systems  Respiratory: Positive for cough and shortness of breath.   All other systems reviewed and are negative.    Physical Exam Updated Vital Signs BP 127/88 (BP Location: Left Arm)   Pulse (!) 114   Temp 98.3 F (36.8 C) (Oral)   Resp (!) 22   Ht 5\' 8"  (1.727 m)   Wt 128.6 kg   LMP 11/14/2018   SpO2 95%   BMI 43.12 kg/m   Physical Exam  Nursing note and vitals reviewed.  37 year old female, appears mildly dyspneic, but is in no acute distress. Vital signs are significant for elevated heart rate and respiratory rate. Oxygen saturation is 95%, which is normal. Head is normocephalic and atraumatic. PERRLA,  EOMI. Oropharynx is clear. Neck is nontender and supple without adenopathy or JVD. Back is nontender and there is no CVA tenderness. Lungs have diffuse inspiratory and expiratory wheezes without rales or rhonchi. Chest is nontender. Heart has regular rate and rhythm without murmur. Abdomen is soft, flat, nontender without masses or hepatosplenomegaly and peristalsis is normoactive. Extremities have no cyanosis or edema, full range of motion is present. Skin is warm and dry without rash. Neurologic: Mental status is normal, cranial nerves are intact, there are no motor or sensory deficits.  ED Treatments / Results  LAB RESULTS Results for orders placed or performed during the hospital encounter of 11/14/18  CBC with Differential  Result Value Ref Range   WBC 6.2 4.0 - 10.5 K/uL   RBC 5.10 3.87 - 5.11 MIL/uL   Hemoglobin 13.0 12.0 - 15.0 g/dL   HCT 16.1 09.6 - 04.5 %   MCV 82.0 80.0 - 100.0 fL   MCH 25.5 (L) 26.0 - 34.0 pg   MCHC 31.1 30.0 - 36.0 g/dL    RDW 40.9 (H) 81.1 - 15.5 %   Platelets 345 150 - 400 K/uL   nRBC 0.0 0.0 - 0.2 %   Neutrophils Relative % 91 %   Neutro Abs 5.6 1.7 - 7.7 K/uL   Lymphocytes Relative 7 %   Lymphs Abs 0.4 (L) 0.7 - 4.0 K/uL   Monocytes Relative 2 %   Monocytes Absolute 0.1 0.1 - 1.0 K/uL   Eosinophils Relative 0 %   Eosinophils Absolute 0.0 0.0 - 0.5 K/uL   Basophils Relative 0 %   Basophils Absolute 0.0 0.0 - 0.1 K/uL   Immature Granulocytes 0 %   Abs Immature Granulocytes 0.02 0.00 - 0.07 K/uL  Basic metabolic panel  Result Value Ref Range   Sodium 137 135 - 145 mmol/L   Potassium 3.5 3.5 - 5.1 mmol/L   Chloride 104 98 - 111 mmol/L   CO2 24 22 - 32 mmol/L   Glucose, Bld 153 (H) 70 - 99 mg/dL   BUN 13 6 - 20 mg/dL   Creatinine, Ser 9.14 (H) 0.44 - 1.00 mg/dL   Calcium 9.1 8.9 - 78.2 mg/dL   GFR calc non Af Amer >60 >60 mL/min   GFR calc Af Amer >60 >60 mL/min   Anion gap 9 5 - 15    EKG EKG Interpretation  Date/Time:  Tuesday November 14 2018 20:47:58 EST Ventricular Rate:  113 PR Interval:    QRS Duration: 80 QT Interval:  313 QTC Calculation: 430 R Axis:   59 Text Interpretation:  Sinus tachycardia Right atrial enlargement Otherwise within normal limits No old tracing to compare Confirmed by Dione Booze ([id number]) on 11/14/2018 11:23:03 PM   Radiology Dg Chest 2 View  Result Date: 11/14/2018 CLINICAL DATA:  Cough EXAM: CHEST - 2 VIEW COMPARISON:  None FINDINGS: Peribronchial thickening. Heart and mediastinal contours are within normal limits. No focal opacities or effusions. No acute bony abnormality. IMPRESSION: Bronchitic changes. Electronically Signed   By: Charlett Nose M.D.   On: 11/14/2018 21:17    Procedures Procedures  Medications Ordered in ED Medications  acetaminophen (TYLENOL) tablet 650 mg (has no administration in time range)    Or  acetaminophen (TYLENOL) suppository 650 mg (has no administration in time range)  ondansetron (ZOFRAN) tablet 4 mg (has no  administration in time range)    Or  ondansetron (ZOFRAN) injection 4 mg (has no administration in time range)  enoxaparin (LOVENOX) injection 40 mg (  40 mg Subcutaneous Given 11/15/18 0716)  albuterol (PROVENTIL) (2.5 MG/3ML) 0.083% nebulizer solution 2.5 mg (has no administration in time range)  budesonide (PULMICORT) nebulizer solution 0.25 mg (0.25 mg Nebulization Given 11/15/18 0731)  methylPREDNISolone sodium succinate (SOLU-MEDROL) 40 mg/mL injection 40 mg (40 mg Intravenous Given 11/15/18 0730)  albuterol (PROVENTIL) (2.5 MG/3ML) 0.083% nebulizer solution 2.5 mg (2.5 mg Nebulization Given 11/15/18 0731)  LORazepam (ATIVAN) tablet 1 mg (has no administration in time range)    Or  LORazepam (ATIVAN) injection 1 mg (has no administration in time range)  thiamine (VITAMIN B-1) tablet 100 mg (has no administration in time range)    Or  thiamine (B-1) injection 100 mg (has no administration in time range)  folic acid (FOLVITE) tablet 1 mg (has no administration in time range)  multivitamin with minerals tablet 1 tablet (has no administration in time range)  azithromycin (ZITHROMAX) 500 mg in sodium chloride 0.9 % 250 mL IVPB (500 mg Intravenous New Bag/Given 11/15/18 0731)  albuterol (PROVENTIL) (2.5 MG/3ML) 0.083% nebulizer solution 5 mg (5 mg Nebulization Given 11/14/18 2050)  ipratropium-albuterol (DUONEB) 0.5-2.5 (3) MG/3ML nebulizer solution 3 mL (3 mLs Nebulization Given 11/14/18 2331)  predniSONE (DELTASONE) tablet 60 mg (60 mg Oral Given 11/14/18 2353)  ipratropium-albuterol (DUONEB) 0.5-2.5 (3) MG/3ML nebulizer solution 3 mL (3 mLs Nebulization Given 11/15/18 0135)     Initial Impression / Assessment and Plan / ED Course  I have reviewed the triage vital signs and the nursing notes.  Pertinent imaging results that were available during my care of the patient were reviewed by me and considered in my medical decision making (see chart for details).  Cough with bronchospasm.  Chest x-ray  is obtained and shows no evidence of pneumonia.  Old records are reviewed, and she has no relevant past visits.  She will be given additional albuterol with ipratropium and also be given an initial dose of prednisone.  Following second nebulizer treatment, patient states she is not feeling any better, but there is clearly improvement in her wheezing.  She will be given a third nebulizer treatment.  Following third nebulizer treatment, patient stated that she felt better.  She seemed to be resting more comfortably.  However, oxygen saturations were noted to be 91% at rest.  She was ambulated in the emergency department, and oxygen saturation dropped to 88%.  Case is discussed with Dr. Toniann FailKakrakandy of [facility], who agrees to admit the patient.  Screening labs were obtained and were unremarkable.  Final Clinical Impressions(s) / ED Diagnoses   Final diagnoses:  Acute bronchitis with bronchospasm  Hypoxia    ED Discharge Orders    None       Dione BoozeGlick, Buckley Bradly, MD 11/15/18 [phone]

## 2018-11-15 ENCOUNTER — Encounter (HOSPITAL_COMMUNITY): Payer: Self-pay | Admitting: Internal Medicine

## 2018-11-15 ENCOUNTER — Other Ambulatory Visit: Payer: Self-pay

## 2018-11-15 DIAGNOSIS — J209 Acute bronchitis, unspecified: Secondary | ICD-10-CM | POA: Diagnosis present

## 2018-11-15 DIAGNOSIS — Z720 Tobacco use: Secondary | ICD-10-CM | POA: Diagnosis present

## 2018-11-15 DIAGNOSIS — R739 Hyperglycemia, unspecified: Secondary | ICD-10-CM

## 2018-11-15 LAB — CBC WITH DIFFERENTIAL/PLATELET
ABS IMMATURE GRANULOCYTES: 0.02 10*3/uL (ref 0.00–0.07)
BASOS ABS: 0 10*3/uL (ref 0.0–0.1)
Basophils Relative: 0 %
Eosinophils Absolute: 0 10*3/uL (ref 0.0–0.5)
Eosinophils Relative: 0 %
HCT: 41.8 % (ref 36.0–46.0)
HEMOGLOBIN: 13 g/dL (ref 12.0–15.0)
IMMATURE GRANULOCYTES: 0 %
Lymphocytes Relative: 7 %
Lymphs Abs: 0.4 10*3/uL — ABNORMAL LOW (ref 0.7–4.0)
MCH: 25.5 pg — ABNORMAL LOW (ref 26.0–34.0)
MCHC: 31.1 g/dL (ref 30.0–36.0)
MCV: 82 fL (ref 80.0–100.0)
Monocytes Absolute: 0.1 10*3/uL (ref 0.1–1.0)
Monocytes Relative: 2 %
NEUTROS ABS: 5.6 10*3/uL (ref 1.7–7.7)
NRBC: 0 % (ref 0.0–0.2)
Neutrophils Relative %: 91 %
Platelets: 345 10*3/uL (ref 150–400)
RBC: 5.1 MIL/uL (ref 3.87–5.11)
RDW: 16.7 % — ABNORMAL HIGH (ref 11.5–15.5)
WBC: 6.2 10*3/uL (ref 4.0–10.5)

## 2018-11-15 LAB — BASIC METABOLIC PANEL
Anion gap: 9 (ref 5–15)
BUN: 13 mg/dL (ref 6–20)
CHLORIDE: 104 mmol/L (ref 98–111)
CO2: 24 mmol/L (ref 22–32)
Calcium: 9.1 mg/dL (ref 8.9–10.3)
Creatinine, Ser: 1.03 mg/dL — ABNORMAL HIGH (ref 0.44–1.00)
GFR calc non Af Amer: >60 mL/min (ref >60)
Glucose, Bld: 153 mg/dL — ABNORMAL HIGH (ref 70–99)
POTASSIUM: 3.5 mmol/L (ref 3.5–5.1)
SODIUM: 137 mmol/L (ref 135–145)

## 2018-11-15 LAB — RESPIRATORY PANEL BY PCR
Adenovirus: NOT DETECTED
Bordetella pertussis: NOT DETECTED
CORONAVIRUS 229E-RVPPCR: NOT DETECTED
Chlamydophila pneumoniae: NOT DETECTED
Coronavirus HKU1: NOT DETECTED
Coronavirus NL63: NOT DETECTED
Coronavirus OC43: NOT DETECTED
Influenza A: NOT DETECTED
Influenza B: NOT DETECTED
Metapneumovirus: NOT DETECTED
Mycoplasma pneumoniae: NOT DETECTED
PARAINFLUENZA VIRUS 2-RVPPCR: NOT DETECTED
Parainfluenza Virus 1: NOT DETECTED
Parainfluenza Virus 3: NOT DETECTED
Parainfluenza Virus 4: NOT DETECTED
Respiratory Syncytial Virus: NOT DETECTED
Rhinovirus / Enterovirus: NOT DETECTED

## 2018-11-15 LAB — HIV ANTIBODY (ROUTINE TESTING W REFLEX): HIV SCREEN 4TH GENERATION: NONREACTIVE

## 2018-11-15 LAB — HEPATIC FUNCTION PANEL
ALT: 23 U/L (ref 0–44)
AST: 25 U/L (ref 15–41)
Albumin: 3.7 g/dL (ref 3.5–5.0)
Alkaline Phosphatase: 80 U/L (ref 38–126)
BILIRUBIN DIRECT: 0.1 mg/dL (ref 0.0–0.2)
Indirect Bilirubin: 0.5 mg/dL (ref 0.3–0.9)
Total Bilirubin: 0.6 mg/dL (ref 0.3–1.2)
Total Protein: 7.4 g/dL (ref 6.5–8.1)

## 2018-11-15 LAB — TROPONIN I: Troponin I: <0.03 ng/mL (ref <0.03)

## 2018-11-15 LAB — HEMOGLOBIN A1C
Hgb A1c MFr Bld: 5.5 % (ref 4.8–5.6)
Mean Plasma Glucose: 111.15 mg/dL

## 2018-11-15 LAB — PREGNANCY, URINE: PREG TEST UR: NEGATIVE

## 2018-11-15 MED ORDER — ALBUTEROL SULFATE (2.5 MG/3ML) 0.083% IN NEBU
2.5000 mg | INHALATION_SOLUTION | RESPIRATORY_TRACT | Status: DC | PRN
  Administered 2018-11-16: 2.5 mg via RESPIRATORY_TRACT
  Filled 2018-11-15: qty 3

## 2018-11-15 MED ORDER — SODIUM CHLORIDE 0.9 % IV SOLN
500.0000 mg | Freq: Every day | INTRAVENOUS | Status: DC
  Administered 2018-11-15 – 2018-11-18 (×4): 500 mg via INTRAVENOUS
  Filled 2018-11-15 (×4): qty 500

## 2018-11-15 MED ORDER — METHYLPREDNISOLONE SODIUM SUCC 125 MG IJ SOLR
60.0000 mg | Freq: Two times a day (BID) | INTRAMUSCULAR | Status: DC
  Administered 2018-11-15 – 2018-11-16 (×2): 60 mg via INTRAVENOUS
  Filled 2018-11-15 (×2): qty 2

## 2018-11-15 MED ORDER — ACETAMINOPHEN 325 MG PO TABS
650.0000 mg | ORAL_TABLET | Freq: Four times a day (QID) | ORAL | Status: DC | PRN
  Administered 2018-11-16 – 2018-11-17 (×3): 650 mg via ORAL
  Filled 2018-11-15 (×4): qty 2

## 2018-11-15 MED ORDER — IPRATROPIUM-ALBUTEROL 0.5-2.5 (3) MG/3ML IN SOLN
3.0000 mL | Freq: Four times a day (QID) | RESPIRATORY_TRACT | Status: DC
  Administered 2018-11-15 – 2018-11-18 (×11): 3 mL via RESPIRATORY_TRACT
  Filled 2018-11-15 (×12): qty 3

## 2018-11-15 MED ORDER — BUDESONIDE 0.25 MG/2ML IN SUSP
0.2500 mg | Freq: Two times a day (BID) | RESPIRATORY_TRACT | Status: DC
  Administered 2018-11-15 – 2018-11-17 (×5): 0.25 mg via RESPIRATORY_TRACT
  Filled 2018-11-15 (×6): qty 2

## 2018-11-15 MED ORDER — ALBUTEROL SULFATE (2.5 MG/3ML) 0.083% IN NEBU: 2.5000 mg | INHALATION_SOLUTION | RESPIRATORY_TRACT | Status: DC

## 2018-11-15 MED ORDER — LORAZEPAM 1 MG PO TABS
1.0000 mg | ORAL_TABLET | Freq: Four times a day (QID) | ORAL | Status: AC | PRN
  Administered 2018-11-16: 1 mg via ORAL
  Filled 2018-11-15: qty 1

## 2018-11-15 MED ORDER — ALBUTEROL SULFATE (2.5 MG/3ML) 0.083% IN NEBU
2.5000 mg | INHALATION_SOLUTION | Freq: Four times a day (QID) | RESPIRATORY_TRACT | Status: DC
  Administered 2018-11-15: 2.5 mg via RESPIRATORY_TRACT
  Filled 2018-11-15: qty 3

## 2018-11-15 MED ORDER — METHYLPREDNISOLONE SODIUM SUCC 40 MG IJ SOLR
40.0000 mg | Freq: Two times a day (BID) | INTRAMUSCULAR | Status: DC
  Administered 2018-11-15: 40 mg via INTRAVENOUS
  Filled 2018-11-15: qty 1

## 2018-11-15 MED ORDER — ONDANSETRON HCL 4 MG/2ML IJ SOLN: 4.0000 mg | Freq: Four times a day (QID) | INTRAMUSCULAR | Status: DC | PRN

## 2018-11-15 MED ORDER — VITAMIN B-1 100 MG PO TABS
100.0000 mg | ORAL_TABLET | Freq: Every day | ORAL | Status: DC
  Administered 2018-11-15 – 2018-11-18 (×4): 100 mg via ORAL
  Filled 2018-11-15 (×4): qty 1

## 2018-11-15 MED ORDER — THIAMINE HCL 100 MG/ML IJ SOLN: 100.0000 mg | Freq: Every day | INTRAMUSCULAR | Status: DC

## 2018-11-15 MED ORDER — ONDANSETRON HCL 4 MG PO TABS: 4.0000 mg | ORAL_TABLET | Freq: Four times a day (QID) | ORAL | Status: DC | PRN

## 2018-11-15 MED ORDER — FOLIC ACID 1 MG PO TABS
1.0000 mg | ORAL_TABLET | Freq: Every day | ORAL | Status: DC
  Administered 2018-11-15 – 2018-11-18 (×4): 1 mg via ORAL
  Filled 2018-11-15 (×4): qty 1

## 2018-11-15 MED ORDER — IPRATROPIUM-ALBUTEROL 0.5-2.5 (3) MG/3ML IN SOLN
3.0000 mL | Freq: Once | RESPIRATORY_TRACT | Status: AC
  Administered 2018-11-15: 3 mL via RESPIRATORY_TRACT
  Filled 2018-11-15: qty 3

## 2018-11-15 MED ORDER — ADULT MULTIVITAMIN W/MINERALS CH
1.0000 | ORAL_TABLET | Freq: Every day | ORAL | Status: DC
  Administered 2018-11-15 – 2018-11-18 (×4): 1 via ORAL
  Filled 2018-11-15 (×4): qty 1

## 2018-11-15 MED ORDER — GUAIFENESIN ER 600 MG PO TB12
1200.0000 mg | ORAL_TABLET | Freq: Two times a day (BID) | ORAL | Status: DC
  Administered 2018-11-15 – 2018-11-18 (×7): 1200 mg via ORAL
  Filled 2018-11-15 (×7): qty 2

## 2018-11-15 MED ORDER — LORAZEPAM 2 MG/ML IJ SOLN: 1.0000 mg | Freq: Four times a day (QID) | INTRAMUSCULAR | Status: AC | PRN

## 2018-11-15 MED ORDER — ENOXAPARIN SODIUM 40 MG/0.4ML ~~LOC~~ SOLN
40.0000 mg | SUBCUTANEOUS | Status: DC
  Administered 2018-11-15 – 2018-11-18 (×4): 40 mg via SUBCUTANEOUS
  Filled 2018-11-15 (×4): qty 0.4

## 2018-11-15 MED ORDER — ACETAMINOPHEN 650 MG RE SUPP: 650.0000 mg | Freq: Four times a day (QID) | RECTAL | Status: DC | PRN

## 2018-11-15 NOTE — ED Notes (Signed)
Ambulating pulse ox was 88% on room air,  Heart rate- 122bpm

## 2018-11-15 NOTE — Progress Notes (Signed)
Patient was admitted early this morning after midnight and H&P has been reviewed and I am in current agreement with assessment and plan done by Dr. Midge MiniumArshad Kakrakandy.  Additional changes the plan of care been made accordingly.  The patient is a 37 year old morbidly obese African-American female with a past female history significant for tobacco and alcohol abuse as well as asthma child who presented to the emergency room with increasing shortness of breath with wheezing and productive cough for the last 3 days.  She also had associated pleuritic type chest pain on coughing along with abdominal pain.  She is found to be hypoxic and wheezing in the ED and chest x-ray showed bronchitic changes.  She is admitted for the further management of acute bronchitis and I have adjusted her breathing medications and placed on duo nebs.  She is also on budesonide and I also added a flutter valve and incentive spirometer and scheduled guaifenesin.  We will also check a respiratory virus panel.  Continue to monitor patient's breathing status and monitor her response to clinical intervention as well as repeating a chest x-ray and blood work in the a.m.

## 2018-11-15 NOTE — H&P (Signed)
History and Physical    Rebekah Lifeasiya D Heinkel ZOX:[id number] DOB: 10/10/1981 DOA: 11/14/2018  PCP: Patient, No Pcp Per  Patient coming from: Home.  Chief Complaint: Shortness of breath.  HPI: Rebekah Evans is a 37 y.o. female with history of tobacco and alcohol abuse and asthma as a child presents to the ER because of shortness of breath with wheezing and productive cough for last 3 days.  Has been having some pleuritic type of chest pain on coughing.  Otherwise chest pain-free.  This patient symptoms worsened patient came to the ER.  ED Course: In the ER patient is found to be hypoxic wheezing chest x-ray showing bronchitic changes.  Patient was given nebulizer treatment prednisone and admitted for further management of acute bronchitis.  EKG shows sinus tachycardia.  Review of Systems: As per HPI, rest all negative.   Past Medical History:  Diagnosis Date  . Medical history non-contributory     Past Surgical History:  Procedure Laterality Date  . NO PAST SURGERIES       reports that she has been smoking. She has been smoking about 0.50 packs per day. She uses smokeless tobacco. She reports that she drinks alcohol. She reports that she does not use drugs.  No Known Allergies  History reviewed. No pertinent family history.  Prior to Admission medications   Medication Sig Start Date End Date Taking? Authorizing Provider  naproxen (NAPROSYN) 500 MG tablet Take 1 tablet (500 mg total) by mouth 2 (two) times daily. Patient not taking: Reported on 11/15/2018 04/23/18   Janne NapoleonNeese, Hope M, NP  oxyCODONE-acetaminophen (PERCOCET/ROXICET) 5-325 MG tablet Take 1 tablet by mouth every 8 (eight) hours as needed for severe pain. Patient not taking: Reported on 11/15/2018 11/18/16   Audry PiliMohr, Tyler, PA-C  traMADol (ULTRAM) 50 MG tablet Take 1 tablet (50 mg total) by mouth every 6 (six) hours as needed. Patient not taking: Reported on 11/15/2018 04/23/18   Janne NapoleonNeese, Hope M, NP    Physical Exam: Vitals:   11/15/18 0130 11/15/18 0135 11/15/18 0305 11/15/18 0348  BP: 127/85   (!) 149/103  Pulse: (!) 101  (!) 122 94  Resp: 12   18  Temp:      TempSrc:      SpO2: 93% 92% (!) 88% 96%  Weight:      Height:          Constitutional: Moderately built and nourished. Vitals:   11/15/18 0130 11/15/18 0135 11/15/18 0305 11/15/18 0348  BP: 127/85   (!) 149/103  Pulse: (!) 101  (!) 122 94  Resp: 12   18  Temp:      TempSrc:      SpO2: 93% 92% (!) 88% 96%  Weight:      Height:       Eyes: Anicteric no pallor. ENMT: No discharge from the ears eyes nose or mouth. Neck: No mass felt.  No neck rigidity. Respiratory: Bilateral expiratory wheeze and no crepitations. Cardiovascular: S1-S2 heard no murmurs appreciated. Abdomen: Soft nontender bowel sounds present. Musculoskeletal: No edema.  No joint effusion. Skin: No rash. Neurologic: Alert awake oriented to time place and person.  Moves all extremities. Psychiatric: Appears normal.  Normal affect.   Labs on Admission: I have personally reviewed following labs and imaging studies  CBC: No results for input(s): WBC, NEUTROABS, HGB, HCT, MCV, PLT in the last 168 hours. Basic Metabolic Panel: No results for input(s): NA, K, CL, CO2, GLUCOSE, BUN, CREATININE, CALCIUM, MG, PHOS in the  last 168 hours. GFR: CrCl cannot be calculated (Patient's most recent lab result is older than the maximum 21 days allowed.). Liver Function Tests: No results for input(s): AST, ALT, ALKPHOS, BILITOT, PROT, ALBUMIN in the last 168 hours. No results for input(s): LIPASE, AMYLASE in the last 168 hours. No results for input(s): AMMONIA in the last 168 hours. Coagulation Profile: No results for input(s): INR, PROTIME in the last 168 hours. Cardiac Enzymes: No results for input(s): CKTOTAL, CKMB, CKMBINDEX, TROPONINI in the last 168 hours. BNP (last 3 results) No results for input(s): PROBNP in the last 8760 hours. HbA1C: No results for input(s): HGBA1C in the  last 72 hours. CBG: No results for input(s): GLUCAP in the last 168 hours. Lipid Profile: No results for input(s): CHOL, HDL, LDLCALC, TRIG, CHOLHDL, LDLDIRECT in the last 72 hours. Thyroid Function Tests: No results for input(s): TSH, T4TOTAL, FREET4, T3FREE, THYROIDAB in the last 72 hours. Anemia Panel: No results for input(s): VITAMINB12, FOLATE, FERRITIN, TIBC, IRON, RETICCTPCT in the last 72 hours. Urine analysis:    Component Value Date/Time   COLORURINE YELLOW 10/07/2016 1455   APPEARANCEUR HAZY (A) 10/07/2016 1455   LABSPEC 1.020 10/07/2016 1455   PHURINE 6.0 10/07/2016 1455   GLUCOSEU NEGATIVE 10/07/2016 1455   HGBUR LARGE (A) 10/07/2016 1455   BILIRUBINUR NEGATIVE 10/07/2016 1455   KETONESUR NEGATIVE 10/07/2016 1455   PROTEINUR NEGATIVE 10/07/2016 1455   NITRITE NEGATIVE 10/07/2016 1455   LEUKOCYTESUR NEGATIVE 10/07/2016 1455   Sepsis Labs: @LABRCNTIP (procalcitonin:4,lacticidven:4) )No results found for this or any previous visit (from the past 240 hour(s)).   Radiological Exams on Admission: Dg Chest 2 View  Result Date: 11/14/2018 CLINICAL DATA:  Cough EXAM: CHEST - 2 VIEW COMPARISON:  None FINDINGS: Peribronchial thickening. Heart and mediastinal contours are within normal limits. No focal opacities or effusions. No acute bony abnormality. IMPRESSION: Bronchitic changes. Electronically Signed   By: Charlett Nose M.D.   On: 11/14/2018 21:17    EKG: Independently reviewed.  Sinus tachycardia.  Assessment/Plan Principal Problem:   Asthma exacerbation    1. Acute bronchitis -patient has been placed on nebulizer Pulmicort Zithromax and steroids.  Advised to quit smoking.  Check sputum cultures. 2. Tobacco abuse -advised to quit smoking. 3. Patient states he drinks alcohol every other day for which patient has been placed on CIWA protocol. 4. Hyperglycemia -check hemoglobin A1c given the family history of diabetes mellitus.  Hyperglycemia could be from  steroids.  Patient's pregnancy screen, troponin and LFTs are pending.   DVT prophylaxis: Lovenox. Code Status: Full code. Family Communication: Discussed with patient. Disposition Plan: Home. Consults called: None. Admission status: Observation.   Eduard Clos MD [facility] Pager [phone].  If 7PM-7AM, please contact night-coverage [email or web address] Password [email or web address]  11/15/2018, 3:51 AM

## 2018-11-15 NOTE — ED Notes (Signed)
ED TO INPATIENT HANDOFF REPORT  Name/Age/Gender Rebekah Evans 37 y.o. female  Code Status    Code Status Orders  (From admission, onward)         Start     Ordered   11/15/18 0351  Full code  Continuous     11/15/18 0350        Code Status History    This patient has a current code status but no historical code status.      Home/SNF/Other Home  Chief Complaint Shortness of Breath/Chest Pain/Body Aches  Level of Care/Admitting Diagnosis ED Disposition    ED Disposition Condition Comment   Admit  [facility] [[id number]]  Level of Care: Telemetry [5]  Admit to tele based on following criteria: Monitor for Ischemic changes  Diagnosis: Asthma exacerbation [161096][697512]  Admitting Physician: Eduard ClosKAKRAKANDY, ARSHAD N [phone][[id number]]  Attending Physician: Eduard ClosKAKRAKANDY, ARSHAD N [phone][[id number]]  PT Class (Do Not Modify): Observation [104]  PT Acc Code (Do Not Modify): Observation [10022]       Medical History Past Medical History:  Diagnosis Date  . Medical history non-contributory     Allergies No Known Allergies  IV Location/Drains/Wounds Patient Lines/Drains/Airways Status   Active Line/Drains/Airways    Name:   Placement date:   Placement time:   Site:   Days:   Peripheral IV 11/15/18 Left Forearm   11/15/18    0510    Forearm   less than 1          Labs/Imaging Results for orders placed or performed during the hospital encounter of 11/14/18 (from the past 48 hour(s))  Pregnancy, urine     Status: None   Collection Time: 11/15/18  3:49 AM  Result Value Ref Range   Preg Test, Ur NEGATIVE NEGATIVE    Comment:        THE SENSITIVITY OF THIS METHODOLOGY IS >20 mIU/mL. Performed at [facility], [address] [place], [place] [address]   CBC with Differential     Status: Abnormal   Collection Time: 11/15/18  4:58 AM  Result Value Ref Range   WBC 6.2 4.0 - 10.5 K/uL   RBC 5.10 3.87 - 5.11 MIL/uL   Hemoglobin 13.0 12.0 -  15.0 g/dL   HCT 82.941.8 56.236.0 - 13.046.0 %   MCV 82.0 80.0 - 100.0 fL   MCH 25.5 (L) 26.0 - 34.0 pg   MCHC 31.1 30.0 - 36.0 g/dL   RDW 86.516.7 (H) 78.411.5 - 69.615.5 %   Platelets 345 150 - 400 K/uL   nRBC 0.0 0.0 - 0.2 %   Neutrophils Relative % 91 %   Neutro Abs 5.6 1.7 - 7.7 K/uL   Lymphocytes Relative 7 %   Lymphs Abs 0.4 (L) 0.7 - 4.0 K/uL   Monocytes Relative 2 %   Monocytes Absolute 0.1 0.1 - 1.0 K/uL   Eosinophils Relative 0 %   Eosinophils Absolute 0.0 0.0 - 0.5 K/uL   Basophils Relative 0 %   Basophils Absolute 0.0 0.0 - 0.1 K/uL   Immature Granulocytes 0 %   Abs Immature Granulocytes 0.02 0.00 - 0.07 K/uL    Comment: Performed at [facility], [address] [place], [place] [address]  Basic metabolic panel     Status: Abnormal   Collection Time: 11/15/18  4:58 AM  Result Value Ref Range   Sodium 137 135 - 145 mmol/L   Potassium 3.5 3.5 - 5.1 mmol/L   Chloride 104  98 - 111 mmol/L   CO2 24 22 - 32 mmol/L   Glucose, Bld 153 (H) 70 - 99 mg/dL   BUN 13 6 - 20 mg/dL   Creatinine, Ser 6.04 (H) 0.44 - 1.00 mg/dL   Calcium 9.1 8.9 - 54.0 mg/dL   GFR calc non Af Amer >60 >60 mL/min   GFR calc Af Amer >60 >60 mL/min   Anion gap 9 5 - 15    Comment: Performed at [facility], [address] [place], [place] [address]  Troponin I - Once     Status: None   Collection Time: 11/15/18  7:24 AM  Result Value Ref Range   Troponin I <0.03 <0.03 ng/mL    Comment: Performed at [facility], [address], [place], [place] [address]  Hemoglobin A1c     Status: None   Collection Time: 11/15/18  7:24 AM  Result Value Ref Range   Hgb A1c MFr Bld 5.5 4.8 - 5.6 %    Comment: (NOTE) Pre diabetes:          5.7%-6.4% Diabetes:              >6.4% Glycemic control for   <7.0% adults with diabetes    Mean Plasma Glucose 111.15 mg/dL    Comment: Performed at [facility], [address], [place], [place] [address]  Hepatic function panel      Status: None   Collection Time: 11/15/18  7:24 AM  Result Value Ref Range   Total Protein 7.4 6.5 - 8.1 g/dL   Albumin 3.7 3.5 - 5.0 g/dL   AST 25 15 - 41 U/L   ALT 23 0 - 44 U/L   Alkaline Phosphatase 80 38 - 126 U/L   Total Bilirubin 0.6 0.3 - 1.2 mg/dL   Bilirubin, Direct 0.1 0.0 - 0.2 mg/dL   Indirect Bilirubin 0.5 0.3 - 0.9 mg/dL    Comment: Performed at [facility], [address] [place], [place] [address]   Dg Chest 2 View  Result Date: 11/14/2018 CLINICAL DATA:  Cough EXAM: CHEST - 2 VIEW COMPARISON:  None FINDINGS: Peribronchial thickening. Heart and mediastinal contours are within normal limits. No focal opacities or effusions. No acute bony abnormality. IMPRESSION: Bronchitic changes. Electronically Signed   By: Charlett Nose M.D.   On: 11/14/2018 21:17   EKG Interpretation  Date/Time:  Tuesday November 14 2018 20:47:58 EST Ventricular Rate:  113 PR Interval:    QRS Duration: 80 QT Interval:  313 QTC Calculation: 430 R Axis:   59 Text Interpretation:  Sinus tachycardia Right atrial enlargement Otherwise within normal limits No old tracing to compare Confirmed by Dione Booze ([id number]) on 11/14/2018 11:23:03 PM Also confirmed by Dione Booze ([id number]), editor Barbette Hair [phone])  on 11/15/2018 7:04:09 AM   Pending Labs Unresulted Labs (From admission, onward)    Start     Ordered   11/22/18 0500  Creatinine, serum  (enoxaparin (LOVENOX)    CrCl >/= 30 ml/min)  Weekly,   R    Comments:  while on enoxaparin therapy    11/15/18 0350   11/16/18 0500  CBC with Differential/Platelet  Tomorrow morning,   R     11/15/18 1104   11/16/18 0500  Comprehensive metabolic panel  Tomorrow morning,   R     11/15/18 1104   11/16/18 0500  Magnesium  Tomorrow morning,   R     11/15/18 1104  11/16/18 0500  Phosphorus  Tomorrow morning,   R     11/15/18 1104   11/15/18 1102  Respiratory Panel by PCR  (Respiratory virus panel with precautions)  Once,   R      11/15/18 1101   11/15/18 0645  Expectorated sputum assessment w rflx to resp cult  Once,   R     11/15/18 0644   11/15/18 0350  HIV antibody (Routine Testing)  Once,   R     11/15/18 0350   11/15/18 0350  CBC  (enoxaparin (LOVENOX)    CrCl >/= 30 ml/min)  Once,   R    Comments:  Baseline for enoxaparin therapy IF NOT ALREADY DRAWN.  Notify MD if PLT < 100 K.    11/15/18 0350          Vitals/Pain Today's Vitals   11/15/18 0500 11/15/18 0710 11/15/18 0735 11/15/18 1000  BP: 133/73 133/84  (!) 159/99  Pulse: 100 (!) 105  (!) 105  Resp: 17 (!) 21  (!) 22  Temp:  97.9 F (36.6 C)    TempSrc:  Oral    SpO2: 94% 94% 93% 93%  Weight:      Height:      PainSc:        Isolation Precautions Droplet precaution  Medications Medications  acetaminophen (TYLENOL) tablet 650 mg (has no administration in time range)    Or  acetaminophen (TYLENOL) suppository 650 mg (has no administration in time range)  ondansetron (ZOFRAN) tablet 4 mg (has no administration in time range)    Or  ondansetron (ZOFRAN) injection 4 mg (has no administration in time range)  enoxaparin (LOVENOX) injection 40 mg (40 mg Subcutaneous Given 11/15/18 0716)  albuterol (PROVENTIL) (2.5 MG/3ML) 0.083% nebulizer solution 2.5 mg (has no administration in time range)  budesonide (PULMICORT) nebulizer solution 0.25 mg (0.25 mg Nebulization Given 11/15/18 0731)  LORazepam (ATIVAN) tablet 1 mg (has no administration in time range)    Or  LORazepam (ATIVAN) injection 1 mg (has no administration in time range)  thiamine (VITAMIN B-1) tablet 100 mg (100 mg Oral Given 11/15/18 0933)    Or  thiamine (B-1) injection 100 mg ( Intravenous See Alternative 11/15/18 0933)  folic acid (FOLVITE) tablet 1 mg (1 mg Oral Given 11/15/18 0933)  multivitamin with minerals tablet 1 tablet (1 tablet Oral Given 11/15/18 0933)  azithromycin (ZITHROMAX) 500 mg in sodium chloride 0.9 % 250 mL IVPB (500 mg Intravenous New Bag/Given 11/15/18 0731)   ipratropium-albuterol (DUONEB) 0.5-2.5 (3) MG/3ML nebulizer solution 3 mL (has no administration in time range)  methylPREDNISolone sodium succinate (SOLU-MEDROL) 125 mg/2 mL injection 60 mg (has no administration in time range)  guaiFENesin (MUCINEX) 12 hr tablet 1,200 mg (has no administration in time range)  albuterol (PROVENTIL) (2.5 MG/3ML) 0.083% nebulizer solution 5 mg (5 mg Nebulization Given 11/14/18 2050)  ipratropium-albuterol (DUONEB) 0.5-2.5 (3) MG/3ML nebulizer solution 3 mL (3 mLs Nebulization Given 11/14/18 2331)  predniSONE (DELTASONE) tablet 60 mg (60 mg Oral Given 11/14/18 2353)  ipratropium-albuterol (DUONEB) 0.5-2.5 (3) MG/3ML nebulizer solution 3 mL (3 mLs Nebulization Given 11/15/18 0135)    Mobility walks

## 2018-11-15 NOTE — Progress Notes (Signed)
   11/15/18 2006  Vitals  Temp 98.4 F (36.9 C)  Temp Source Oral  BP 116/70  MAP (mmHg) 82  BP Method Automatic  Patient Position (if appropriate) Sitting  Pulse Rate (!) 118  Pulse Rate Source Monitor  Resp 19  Oxygen Therapy  SpO2 98 %  O2 Device Room Air  [facility]ews protocol initiated

## 2018-11-16 ENCOUNTER — Observation Stay (HOSPITAL_COMMUNITY): Payer: Self-pay

## 2018-11-16 DIAGNOSIS — J45901 Unspecified asthma with (acute) exacerbation: Secondary | ICD-10-CM | POA: Diagnosis present

## 2018-11-16 LAB — CBC WITH DIFFERENTIAL/PLATELET
Abs Immature Granulocytes: 0.11 10*3/uL — ABNORMAL HIGH (ref 0.00–0.07)
Basophils Absolute: 0 10*3/uL (ref 0.0–0.1)
Basophils Relative: 0 %
Eosinophils Absolute: 0 10*3/uL (ref 0.0–0.5)
Eosinophils Relative: 0 %
HCT: 40.6 % (ref 36.0–46.0)
Hemoglobin: 12.5 g/dL (ref 12.0–15.0)
Immature Granulocytes: 1 %
Lymphocytes Relative: 8 %
Lymphs Abs: 1.1 10*3/uL (ref 0.7–4.0)
MCH: 26.3 pg (ref 26.0–34.0)
MCHC: 30.8 g/dL (ref 30.0–36.0)
MCV: 85.5 fL (ref 80.0–100.0)
Monocytes Absolute: 0.5 10*3/uL (ref 0.1–1.0)
Monocytes Relative: 3 %
NEUTROS PCT: 88 %
Neutro Abs: 12 10*3/uL — ABNORMAL HIGH (ref 1.7–7.7)
Platelets: 340 10*3/uL (ref 150–400)
RBC: 4.75 MIL/uL (ref 3.87–5.11)
RDW: 17.2 % — ABNORMAL HIGH (ref 11.5–15.5)
WBC: 13.7 10*3/uL — ABNORMAL HIGH (ref 4.0–10.5)
nRBC: 0 % (ref 0.0–0.2)

## 2018-11-16 LAB — COMPREHENSIVE METABOLIC PANEL
ALBUMIN: 3.6 g/dL (ref 3.5–5.0)
ALT: 19 U/L (ref 0–44)
AST: 20 U/L (ref 15–41)
Alkaline Phosphatase: 82 U/L (ref 38–126)
Anion gap: 7 (ref 5–15)
BUN: 16 mg/dL (ref 6–20)
CHLORIDE: 106 mmol/L (ref 98–111)
CO2: 23 mmol/L (ref 22–32)
Calcium: 9.1 mg/dL (ref 8.9–10.3)
Creatinine, Ser: 1.13 mg/dL — ABNORMAL HIGH (ref 0.44–1.00)
GFR calc Af Amer: >60 mL/min (ref >60)
GFR calc non Af Amer: >60 mL/min (ref >60)
Glucose, Bld: 192 mg/dL — ABNORMAL HIGH (ref 70–99)
Potassium: 4.3 mmol/L (ref 3.5–5.1)
Sodium: 136 mmol/L (ref 135–145)
Total Bilirubin: 0.4 mg/dL (ref 0.3–1.2)
Total Protein: 7 g/dL (ref 6.5–8.1)

## 2018-11-16 LAB — MAGNESIUM: Magnesium: 1.9 mg/dL (ref 1.7–2.4)

## 2018-11-16 LAB — PHOSPHORUS: Phosphorus: 3.1 mg/dL (ref 2.5–4.6)

## 2018-11-16 MED ORDER — BENZONATATE 100 MG PO CAPS
100.0000 mg | ORAL_CAPSULE | Freq: Three times a day (TID) | ORAL | Status: DC | PRN
  Administered 2018-11-16 – 2018-11-17 (×3): 100 mg via ORAL
  Filled 2018-11-16 (×3): qty 1

## 2018-11-16 MED ORDER — METHYLPREDNISOLONE SODIUM SUCC 125 MG IJ SOLR
60.0000 mg | Freq: Four times a day (QID) | INTRAMUSCULAR | Status: DC
  Administered 2018-11-16 – 2018-11-17 (×4): 60 mg via INTRAVENOUS
  Filled 2018-11-16 (×4): qty 2

## 2018-11-16 MED ORDER — KETOROLAC TROMETHAMINE 30 MG/ML IJ SOLN
30.0000 mg | Freq: Once | INTRAMUSCULAR | Status: AC
  Administered 2018-11-16: 30 mg via INTRAVENOUS
  Filled 2018-11-16: qty 1

## 2018-11-16 NOTE — Progress Notes (Addendum)
PROGRESS NOTE    Rebekah Evans  FIE:[id number] DOB: 1981/09/14 DOA: 11/14/2018 PCP: Patient, No Pcp Per  Brief Narrative:  The patient is a 37 year old morbidly obese African-American female with a past female history significant for tobacco and alcohol abuse as well as asthma child who presented to the emergency room with increasing shortness of breath with wheezing and productive cough for the last 3 days. She also had associated pleuritic type chest pain on coughing along with abdominal pain.  She was found to be hypoxic and wheezing in the ED and chest x-ray showed bronchitic changes.  She was admitted for the further management of acute bronchitis and I have adjusted her breathing medications and placed on duo nebs.  She is also on budesonide and I also added a flutter valve and incentive spirometer and scheduled guaifenesin.  Respiratory virus panel was negative. Steroids have been increased due to significant wheezing but patient is no longer hypoxic.  Assessment & Plan:   Principal Problem:   Acute bronchitis Active Problems:   Tobacco abuse  Acute Bronchitis  -Patient is still significantly wheezing -Increased IV Solu-Medrol 60 mg every 12 and went to IV Solu-Medrol 60 mg every 6 scheduled -Continue DuoNeb 3 mL every 6 scheduled -Continue with albuterol nebs 2.5 mg every 2 PRN for wheezing -Given IV azithromycin 500 mg p.o. daily -Continue with guaifenesin 1200 mg p.o. twice daily along with benzonatate 200 mg p.o. 3 times daily PRN for cough -Continue with incentive spirometer and flutter valve -May have underlying COPD/obesity hypoventilation syndrome and will need outpatient PFTs and formal sleep apnea study -Smoking cessation counseling given -Continue monitor respiratory status -Patient is not hypoxic on ambulation but still feeling subjectively dyspneic and wheezing significantly still -Respiratory virus panel was obtained and was negative -Culture was never sent -Repeat  CXR 11/16/18 showed Mild peribronchial thickening, consistent with bronchitic changes. No evidence of lobar consolidation. -Ambulatory screen done showed patient did not desaturate but patient still has significant wheezing  Tobacco Abuse -Smoking cessation counseling given -Consider adding nicotine patch but will discuss with the patient first  Alcohol Use -Concern for possible withdrawal she drinks every other day -Placed on CIWA scale with p.o./IV lorazepam every 6 as needed -Continue with folic acid 1 mg p.o. daily, multivitamin 1 tab p.o. daily, and thiamine 100 mg p.o./IV daily  Hyperglycemia  -Setting of IV steroid demargination however she had hyperglycemia even before steroids were administered -Hemoglobin A1c done and was 5.5 -Blood sugars remain consistently elevated will need to place on sensitive NovoLog sliding scale before meals and at bedtime  Leukocytosis -Likely Reactive in the setting of IV Steroid Demargination -WBC went from 6.2 -> 13.7 -Continue to Monitor for S/Sx of Infection -Empiric Abx with Azithromycin -Repeat CBC in AM  Morbid Obesity -Estimated body mass index is 43.12 kg/m as calculated from the following:   Height as of this encounter: 5\' 8"  (1.727 m).   Weight as of this encounter: 128.6 kg. -Weight Loss Counseling Given  Elevated Cr -Patient's Cr slightly bumped from 1.03 -> 1.11 -Continue to Monitor closely  -Patient's Cr in 2017 was 0.93 -Continue to Monitor Closely   DVT prophylaxis: Enoxaparin 40 mg sq q24h Code Status: FULL CODE Family Communication: Family sleeping at bedside Disposition Plan: Anticipate D/C in the next 24-48 hours if patient's wheezing and dyspnea improves  Consultants:   None  Procedures:   None  Antimicrobials:  Anti-infectives (From admission, onward)   Start     [employer]  Frequency Ordered Stop   11/15/18 0700  azithromycin (ZITHROMAX) 500 mg in sodium chloride 0.9 % 250 mL IVPB     500 mg 250  mL/hr over 60 Minutes Intravenous Daily 11/15/18 0644       Subjective: Seen and examined at bedside and was still somewhat somnolent and drowsy but easily arousable.  Denied any chest pain, lightheadedness but did still feel short of breath and so was significantly wheezing.  No nausea or vomiting.  No other concerns or complaints at this time  Objective: Vitals:   11/16/18 0519 11/16/18 0822 11/16/18 0946 11/16/18 1351  BP: (!) 150/93   139/82  Pulse: (!) 106   100  Resp: 20   17  Temp: 98 F (36.7 C)   97.9 F (36.6 C)  TempSrc: Oral   Oral  SpO2: 95% 91% 93% 95%  Weight:      Height:        Intake/Output Summary (Last 24 hours) at 11/16/2018 1424 Last data filed at 11/16/2018 0600 Gross per 24 hour  Intake 250 ml  Output -  Net 250 ml   Filed Weights   11/14/18 2031  Weight: 128.6 kg   Examination: Physical Exam:  Constitutional: WN/WD morbidly obese AAF in NAD and appears uncomfortable Eyes: Lids and conjunctivae normal, sclerae anicteric  ENMT: External Ears, Nose appear normal. Grossly normal hearing.  Neck: Appears normal, supple, no cervical masses, normal ROM, no appreciable thyromegaly; no appreciable JVD Respiratory: Diminished to auscultation bilaterally with significant bilateral expiratory wheezing; No appreciable rales, rhonchi or crackles. Normal respiratory effort and patient is not tachypenic. No accessory muscle use. Not wearing any supplemental O2 via [place].  Cardiovascular: Tachycardic Rate but Regular Rhythm; No appreciable murmurs / rubs / gallops. S1 and S2 auscultated. Trace extremity edema. Abdomen: Soft, non-tender, Distended 2/2 to body habitus. No masses palpated. No appreciable hepatosplenomegaly. Bowel sounds positive x4.  GU: Deferred. Musculoskeletal: No clubbing / cyanosis of digits/nails.  Skin: No rashes, lesions, ulcers on a limited skin evaluation. No induration; Warm and dry.  Neurologic: CN 2-12 grossly intact with no focal deficits.  Romberg sign and cerebellar reflexes not assessed.  Psychiatric: Normal judgment and insight. Somnolent and Drowsy but arouses easily. Normal mood and appropriate affect.   Data Reviewed: I have personally reviewed following labs and imaging studies  CBC: Recent Labs  Lab 11/15/18 0458 11/16/18 0640  WBC 6.2 13.7*  NEUTROABS 5.6 12.0*  HGB 13.0 12.5  HCT 41.8 40.6  MCV 82.0 85.5  PLT 345 340   Basic Metabolic Panel: Recent Labs  Lab 11/15/18 0458 11/16/18 0640  NA 137 136  K 3.5 4.3  CL 104 106  CO2 24 23  GLUCOSE 153* 192*  BUN 13 16  CREATININE 1.03* 1.13*  CALCIUM 9.1 9.1  MG  --  1.9  PHOS  --  3.1   GFR: Estimated Creatinine Clearance: 98.5 mL/min (A) (by C-G formula based on SCr of 1.13 mg/dL (H)). Liver Function Tests: Recent Labs  Lab 11/15/18 0724 11/16/18 0640  AST 25 20  ALT 23 19  ALKPHOS 80 82  BILITOT 0.6 0.4  PROT 7.4 7.0  ALBUMIN 3.7 3.6   No results for input(s): LIPASE, AMYLASE in the last 168 hours. No results for input(s): AMMONIA in the last 168 hours. Coagulation Profile: No results for input(s): INR, PROTIME in the last 168 hours. Cardiac Enzymes: Recent Labs  Lab 11/15/18 0724  TROPONINI <0.03   BNP (last 3 results) No results  for input(s): PROBNP in the last 8760 hours. HbA1C: Recent Labs    11/15/18 0724  HGBA1C 5.5   CBG: No results for input(s): GLUCAP in the last 168 hours. Lipid Profile: No results for input(s): CHOL, HDL, LDLCALC, TRIG, CHOLHDL, LDLDIRECT in the last 72 hours. Thyroid Function Tests: No results for input(s): TSH, T4TOTAL, FREET4, T3FREE, THYROIDAB in the last 72 hours. Anemia Panel: No results for input(s): VITAMINB12, FOLATE, FERRITIN, TIBC, IRON, RETICCTPCT in the last 72 hours. Sepsis Labs: No results for input(s): PROCALCITON, LATICACIDVEN in the last 168 hours.  Recent Results (from the past 240 hour(s))  Respiratory Panel by PCR     Status: None   Collection Time: 11/15/18 12:13 PM    Result Value Ref Range Status   Adenovirus NOT DETECTED NOT DETECTED Final   Coronavirus 229E NOT DETECTED NOT DETECTED Final   Coronavirus HKU1 NOT DETECTED NOT DETECTED Final   Coronavirus NL63 NOT DETECTED NOT DETECTED Final   Coronavirus OC43 NOT DETECTED NOT DETECTED Final   Metapneumovirus NOT DETECTED NOT DETECTED Final   Rhinovirus / Enterovirus NOT DETECTED NOT DETECTED Final   Influenza A NOT DETECTED NOT DETECTED Final   Influenza B NOT DETECTED NOT DETECTED Final   Parainfluenza Virus 1 NOT DETECTED NOT DETECTED Final   Parainfluenza Virus 2 NOT DETECTED NOT DETECTED Final   Parainfluenza Virus 3 NOT DETECTED NOT DETECTED Final   Parainfluenza Virus 4 NOT DETECTED NOT DETECTED Final   Respiratory Syncytial Virus NOT DETECTED NOT DETECTED Final   Bordetella pertussis NOT DETECTED NOT DETECTED Final   Chlamydophila pneumoniae NOT DETECTED NOT DETECTED Final   Mycoplasma pneumoniae NOT DETECTED NOT DETECTED Final    Comment: Performed at [facility], [address] [place], [place] [address]    Radiology Studies: Dg Chest 2 View  Result Date: 11/14/2018 CLINICAL DATA:  Cough EXAM: CHEST - 2 VIEW COMPARISON:  None FINDINGS: Peribronchial thickening. Heart and mediastinal contours are within normal limits. No focal opacities or effusions. No acute bony abnormality. IMPRESSION: Bronchitic changes. Electronically Signed   By: Charlett Nose M.D.   On: 11/14/2018 21:17   Dg Chest Port 1 View  Result Date: 11/16/2018 CLINICAL DATA:  Shortness of breath. EXAM: PORTABLE CHEST 1 VIEW COMPARISON:  11/14/2018 FINDINGS: Cardiomediastinal silhouette is normal. Mediastinal contours appear intact. There is no evidence of focal airspace consolidation, pleural effusion or pneumothorax. Mild peribronchial thickening again noted. Noted. Osseous structures are without acute abnormality. Soft tissues are grossly normal. IMPRESSION: Mild peribronchial thickening, consistent with bronchitic  changes. No evidence of lobar consolidation. Electronically Signed   By: Ted Mcalpine M.D.   On: 11/16/2018 08:41   Scheduled Meds: . budesonide (PULMICORT) nebulizer solution  0.25 mg Nebulization BID  . enoxaparin (LOVENOX) injection  40 mg Subcutaneous Q24H  . folic acid  1 mg Oral Daily  . guaiFENesin  1,200 mg Oral BID  . ipratropium-albuterol  3 mL Nebulization Q6H  . methylPREDNISolone (SOLU-MEDROL) injection  60 mg Intravenous Q12H  . multivitamin with minerals  1 tablet Oral Daily  . thiamine  100 mg Oral Daily   Or  . thiamine  100 mg Intravenous Daily   Continuous Infusions: . azithromycin 500 mg (11/16/18 0512)     LOS: 0 days   Merlene Laughter, DO [facility] PAGER is on AMION  If 7PM-7AM, please contact night-coverage [email or web address] Password [email or web address] 11/16/2018, 2:24 PM

## 2018-11-16 NOTE — Progress Notes (Signed)
SATURATION QUALIFICATIONS: (This note is used to comply with regulatory documentation for home oxygen)   Patient Saturations on Room Air at Rest = 93%  Patient Saturations on Room Air while Ambulating = 91%   Please briefly explain why patient needs home oxygen: 

## 2018-11-16 NOTE — Plan of Care (Signed)
  Problem: Nutrition: Goal: Adequate nutrition will be maintained Outcome: Progressing   Problem: Pain Managment: Goal: General experience of comfort will improve Outcome: Progressing   Problem: Safety: Goal: Ability to remain free from injury will improve Outcome: Progressing   

## 2018-11-17 ENCOUNTER — Inpatient Hospital Stay (HOSPITAL_COMMUNITY): Payer: Self-pay

## 2018-11-17 DIAGNOSIS — J45901 Unspecified asthma with (acute) exacerbation: Secondary | ICD-10-CM

## 2018-11-17 DIAGNOSIS — J209 Acute bronchitis, unspecified: Principal | ICD-10-CM

## 2018-11-17 DIAGNOSIS — Z720 Tobacco use: Secondary | ICD-10-CM

## 2018-11-17 LAB — CBC WITH DIFFERENTIAL/PLATELET
Abs Immature Granulocytes: 0.12 10*3/uL — ABNORMAL HIGH (ref 0.00–0.07)
Basophils Absolute: 0 10*3/uL (ref 0.0–0.1)
Basophils Relative: 0 %
EOS ABS: 0 10*3/uL (ref 0.0–0.5)
Eosinophils Relative: 0 %
HEMATOCRIT: 40.5 % (ref 36.0–46.0)
Hemoglobin: 12.4 g/dL (ref 12.0–15.0)
Immature Granulocytes: 1 %
LYMPHS ABS: 1.1 10*3/uL (ref 0.7–4.0)
Lymphocytes Relative: 6 %
MCH: 25.5 pg — ABNORMAL LOW (ref 26.0–34.0)
MCHC: 30.6 g/dL (ref 30.0–36.0)
MCV: 83.3 fL (ref 80.0–100.0)
Monocytes Absolute: 0.2 10*3/uL (ref 0.1–1.0)
Monocytes Relative: 1 %
Neutro Abs: 15.5 10*3/uL — ABNORMAL HIGH (ref 1.7–7.7)
Neutrophils Relative %: 92 %
Platelets: 356 10*3/uL (ref 150–400)
RBC: 4.86 MIL/uL (ref 3.87–5.11)
RDW: 17.2 % — ABNORMAL HIGH (ref 11.5–15.5)
WBC: 16.9 10*3/uL — ABNORMAL HIGH (ref 4.0–10.5)
nRBC: 0 % (ref 0.0–0.2)

## 2018-11-17 LAB — COMPREHENSIVE METABOLIC PANEL
ALT: 19 U/L (ref 0–44)
ANION GAP: 9 (ref 5–15)
AST: 15 U/L (ref 15–41)
Albumin: 3.3 g/dL — ABNORMAL LOW (ref 3.5–5.0)
Alkaline Phosphatase: 76 U/L (ref 38–126)
BUN: 19 mg/dL (ref 6–20)
CO2: 21 mmol/L — ABNORMAL LOW (ref 22–32)
Calcium: 8.9 mg/dL (ref 8.9–10.3)
Chloride: 107 mmol/L (ref 98–111)
Creatinine, Ser: 0.91 mg/dL (ref 0.44–1.00)
GFR calc Af Amer: >60 mL/min (ref >60)
GFR calc non Af Amer: >60 mL/min (ref >60)
Glucose, Bld: 137 mg/dL — ABNORMAL HIGH (ref 70–99)
Potassium: 4.2 mmol/L (ref 3.5–5.1)
Sodium: 137 mmol/L (ref 135–145)
Total Bilirubin: 0.3 mg/dL (ref 0.3–1.2)
Total Protein: 6.8 g/dL (ref 6.5–8.1)

## 2018-11-17 LAB — PHOSPHORUS: Phosphorus: 4.1 mg/dL (ref 2.5–4.6)

## 2018-11-17 LAB — MAGNESIUM: Magnesium: 1.8 mg/dL (ref 1.7–2.4)

## 2018-11-17 MED ORDER — BUDESONIDE 0.5 MG/2ML IN SUSP
0.5000 mg | Freq: Two times a day (BID) | RESPIRATORY_TRACT | Status: DC
  Administered 2018-11-17 – 2018-11-18 (×2): 0.5 mg via RESPIRATORY_TRACT
  Filled 2018-11-17 (×2): qty 2

## 2018-11-17 MED ORDER — METHYLPREDNISOLONE SODIUM SUCC 125 MG IJ SOLR
60.0000 mg | Freq: Two times a day (BID) | INTRAMUSCULAR | Status: DC
  Administered 2018-11-17 – 2018-11-18 (×2): 60 mg via INTRAVENOUS
  Filled 2018-11-17 (×2): qty 2

## 2018-11-17 NOTE — Progress Notes (Signed)
PROGRESS NOTE    Rebekah Evans  ZOX:[id number] DOB: [birth year] DOA: 11/14/2018 PCP: Patient, No Pcp Per  Brief Narrative:  The patient is a 37 year old morbidly obese African-American female with a past female history significant for tobacco and alcohol abuse as well as asthma child who presented to the emergency room with increasing shortness of breath with wheezing and productive cough for the last 3 days. She also had associated pleuritic type chest pain on coughing along with abdominal pain.  She was found to be hypoxic and wheezing in the ED and chest x-ray showed bronchitic changes.  She was admitted for the further management of acute bronchitis and I have adjusted her breathing medications and placed on duo nebs.  She is also on budesonide and I also added a flutter valve and incentive spirometer and scheduled guaifenesin.  Respiratory virus panel was negative. Steroids were increased due to significant wheezing but patient is no longer hypoxic. She is doing better today but feels here breathing is still not back to baseline ; Will start weaning her steroids as her wheezing has improved  Assessment & Plan:   Principal Problem:   Acute bronchitis Active Problems:   Tobacco abuse   Asthma exacerbation  Acute Bronchitis, slowly improving -Patient was still significantly wheezing yesterday but this is improved today.  Patient still feels little dyspneic and still not back to baseline -Increased IV Solu-Medrol 60 mg every 12 and went to IV Solu-Medrol 60 mg every 6 scheduled yesterday but will cut back down to every 12 again today and anticipate going to daily tomorrow with p.o. prednisone taper in outpatient setting -Continue DuoNeb 3 mL every 6 scheduled -Continue with albuterol nebs 2.5 mg every 2 PRN for wheezing -Given IV azithromycin 500 mg p.o. daily and change to p.o. -Continue with guaifenesin 1200 mg p.o. twice daily along with benzonatate 200 mg p.o. 3 times daily PRN for  cough -Continue with incentive spirometer and flutter valve -May have underlying COPD/obesity hypoventilation syndrome and will need outpatient PFTs and formal sleep apnea study with a pulmonary consultation -Smoking cessation counseling given -Continue monitor respiratory status -Patient is not hypoxic on ambulation but still feeling subjectively dyspneic and not back to baseline.  Wheezing is improved -Respiratory virus panel was obtained and was negative -Culture was never sent -Repeat CXR 11/16/18 showed Mild peribronchial thickening, consistent with bronchitic changes. No evidence of lobar consolidation. -Chest x-ray 11/17/2018 showed no acute abnormality with no focal infiltrate or sizable effusion -Ambulatory screen done showed patient did not desaturate but patient still has significant wheezing -We will observe 1 more night and anticipate discharging home in the a.m. as she is improving but slowly.  She will need outpatient pulmonary follow-up and a home nebulizer -We will consult care management for assistance with finding a PCP -Will also need to follow up with Pulmonology at D/C  Tobacco Abuse -Smoking cessation counseling given -Consider adding nicotine patch but will discuss with the patient first  Alcohol Use -Concern for possible withdrawal she drinks every other day -Placed on CIWA scale with p.o./IV lorazepam every 6 as needed -Continue with folic acid 1 mg p.o. daily, multivitamin 1 tab p.o. daily, and thiamine 100 mg p.o./IV daily  Hyperglycemia  -Setting of IV steroid demargination however she had hyperglycemia even before steroids were administered -Hemoglobin A1c done and was 5.5 -If Blood sugars remain consistently elevated will need to place on sensitive NovoLog sliding scale before meals and at bedtime  Leukocytosis -Likely Reactive in the  setting of IV Steroid Demargination -WBC went from 6.2 -> 13.7 -> 16.9 -Continue to Monitor for S/Sx of  Infection -Empiric Abx with Azithromycin -Repeat CBC in AM  Morbid Obesity -Estimated body mass index is 43.12 kg/m as calculated from the following:   Height as of this encounter: 5\' 8"  (1.727 m).   Weight as of this encounter: 128.6 kg. -Weight Loss Counseling Given  Elevated Cr, improved  -Patient's Cr slightly bumped from 1.03 -> 1.11 but has improved to 0.91 -Continue to Monitor closely  -Patient's Cr in 2017 was 0.93 -Continue to Monitor Closely   DVT prophylaxis: Enoxaparin 40 mg sq q24h Code Status: FULL CODE Family Communication: Family sleeping at bedside Disposition Plan: Anticipate D/C in the next 24 hours as wheezing is improve and patient's breathing is slowly improving   Consultants:   None  Procedures:   None  Antimicrobials:  Anti-infectives (From admission, onward)   Start     Dose/Rate Route Frequency Ordered Stop   11/15/18 0700  azithromycin (ZITHROMAX) 500 mg in sodium chloride 0.9 % 250 mL IVPB     500 mg 250 mL/hr over 60 Minutes Intravenous Daily 11/15/18 0644       Subjective: Seen and examined at bedside states that she is having a cough without productive sputum.  Breathing is improved somewhat but still not back to baseline.  States the wheezing still there is some but not bad.  No chest pain, lightheadedness or dizziness.  No nausea or vomiting.  Had some pleuritic chest pain last night that was alleviated with Toradol.  No other concerns or complaints at this time  Objective: Vitals:   11/16/18 2012 11/17/18 0211 11/17/18 0457 11/17/18 0812  BP:  122/75 122/83   Pulse:  (!) 101 91   Resp:  18 20   Temp:  98.6 F (37 C) 98 F (36.7 C)   TempSrc:  Oral Oral   SpO2: 94% 93% 97% 93%  Weight:      Height:        Intake/Output Summary (Last 24 hours) at 11/17/2018 1145 Last data filed at 11/17/2018 0601 Gross per 24 hour  Intake 304.3 ml  Output -  Net 304.3 ml   Filed Weights   11/14/18 2031  Weight: 128.6 kg    Examination: Physical Exam:  Constitutional: Well-nourished, well-developed morbidly obese African-American female currently no acute distress appears calm but still complaining of some subjective dyspnea Eyes: Sclera anicteric.  Lids and conjunctive normal ENMT: External ears and nose appear normal Neck: Appears supple no JVD Respiratory: Diminished to auscultation bilaterally with slight appreciable expiratory wheezing.  Patient is not tachypneic or using any accessory muscles of breathing Cardiovascular: Regular rate and rhythm.  No appreciable murmurs, rubs. Abdomen: Soft,, nontender, distended secondary body habitus GU: Deferred Musculoskeletal: No contractures or cyanosis noted Skin: Skin is warm and dry with no appreciable rashes or lesions Neurologic: Cranial nerves II through XII grossly intact Psychiatric: Normal mood and affect.  Intact judgment intact  Data Reviewed: I have personally reviewed following labs and imaging studies  CBC: Recent Labs  Lab 11/15/18 0458 11/16/18 0640 11/17/18 0757  WBC 6.2 13.7* 16.9*  NEUTROABS 5.6 12.0* 15.5*  HGB 13.0 12.5 12.4  HCT 41.8 40.6 40.5  MCV 82.0 85.5 83.3  PLT 345 340 356   Basic Metabolic Panel: Recent Labs  Lab 11/15/18 0458 11/16/18 0640 11/17/18 0757  NA 137 136 137  K 3.5 4.3 4.2  CL 104 106 107  CO2  24 23 21*  GLUCOSE 153* 192* 137*  BUN 13 16 19   CREATININE 1.03* 1.13* 0.91  CALCIUM 9.1 9.1 8.9  MG  --  1.9 1.8  PHOS  --  3.1 4.1   GFR: Estimated Creatinine Clearance: 122.3 mL/min (by C-G formula based on SCr of 0.91 mg/dL). Liver Function Tests: Recent Labs  Lab 11/15/18 0724 11/16/18 0640 11/17/18 0757  AST 25 20 15   ALT 23 19 19   ALKPHOS 80 82 76  BILITOT 0.6 0.4 0.3  PROT 7.4 7.0 6.8  ALBUMIN 3.7 3.6 3.3*   No results for input(s): LIPASE, AMYLASE in the last 168 hours. No results for input(s): AMMONIA in the last 168 hours. Coagulation Profile: No results for input(s): INR, PROTIME  in the last 168 hours. Cardiac Enzymes: Recent Labs  Lab 11/15/18 0724  TROPONINI <0.03   BNP (last 3 results) No results for input(s): PROBNP in the last 8760 hours. HbA1C: Recent Labs    11/15/18 0724  HGBA1C 5.5   CBG: No results for input(s): GLUCAP in the last 168 hours. Lipid Profile: No results for input(s): CHOL, HDL, LDLCALC, TRIG, CHOLHDL, LDLDIRECT in the last 72 hours. Thyroid Function Tests: No results for input(s): TSH, T4TOTAL, FREET4, T3FREE, THYROIDAB in the last 72 hours. Anemia Panel: No results for input(s): VITAMINB12, FOLATE, FERRITIN, TIBC, IRON, RETICCTPCT in the last 72 hours. Sepsis Labs: No results for input(s): PROCALCITON, LATICACIDVEN in the last 168 hours.  Recent Results (from the past 240 hour(s))  Respiratory Panel by PCR     Status: None   Collection Time: 11/15/18 12:13 PM  Result Value Ref Range Status   Adenovirus NOT DETECTED NOT DETECTED Final   Coronavirus 229E NOT DETECTED NOT DETECTED Final   Coronavirus HKU1 NOT DETECTED NOT DETECTED Final   Coronavirus NL63 NOT DETECTED NOT DETECTED Final   Coronavirus OC43 NOT DETECTED NOT DETECTED Final   Metapneumovirus NOT DETECTED NOT DETECTED Final   Rhinovirus / Enterovirus NOT DETECTED NOT DETECTED Final   Influenza A NOT DETECTED NOT DETECTED Final   Influenza B NOT DETECTED NOT DETECTED Final   Parainfluenza Virus 1 NOT DETECTED NOT DETECTED Final   Parainfluenza Virus 2 NOT DETECTED NOT DETECTED Final   Parainfluenza Virus 3 NOT DETECTED NOT DETECTED Final   Parainfluenza Virus 4 NOT DETECTED NOT DETECTED Final   Respiratory Syncytial Virus NOT DETECTED NOT DETECTED Final   Bordetella pertussis NOT DETECTED NOT DETECTED Final   Chlamydophila pneumoniae NOT DETECTED NOT DETECTED Final   Mycoplasma pneumoniae NOT DETECTED NOT DETECTED Final    Comment: Performed at [facility], [address] [place], [place] [address]    Radiology Studies: Dg Chest Port 1 View  Result  Date: 11/17/2018 CLINICAL DATA:  Bronchitis EXAM: PORTABLE CHEST 1 VIEW COMPARISON:  11/16/2018 FINDINGS: Cardiac shadows within normal limits. The lungs are well aerated bilaterally. No focal infiltrate or sizable effusion is seen. No bony abnormality is noted. IMPRESSION: No acute abnormality noted. Electronically Signed   By: Alcide CleverMark  Lukens M.D.   On: 11/17/2018 06:15   Dg Chest Port 1 View  Result Date: 11/16/2018 CLINICAL DATA:  Shortness of breath. EXAM: PORTABLE CHEST 1 VIEW COMPARISON:  11/14/2018 FINDINGS: Cardiomediastinal silhouette is normal. Mediastinal contours appear intact. There is no evidence of focal airspace consolidation, pleural effusion or pneumothorax. Mild peribronchial thickening again noted. Noted. Osseous structures are without acute abnormality. Soft tissues are grossly normal. IMPRESSION: Mild peribronchial thickening, consistent with bronchitic changes. No evidence of  lobar consolidation. Electronically Signed   By: Ted Mcalpine M.D.   On: 11/16/2018 08:41   Scheduled Meds: . budesonide (PULMICORT) nebulizer solution  0.5 mg Nebulization BID  . enoxaparin (LOVENOX) injection  40 mg Subcutaneous Q24H  . folic acid  1 mg Oral Daily  . guaiFENesin  1,200 mg Oral BID  . ipratropium-albuterol  3 mL Nebulization Q6H  . methylPREDNISolone (SOLU-MEDROL) injection  60 mg Intravenous Q12H  . multivitamin with minerals  1 tablet Oral Daily  . thiamine  100 mg Oral Daily   Or  . thiamine  100 mg Intravenous Daily   Continuous Infusions: . azithromycin 500 mg (11/17/18 0601)    LOS: 1 day   Merlene Laughter, DO [facility] PAGER is on AMION  If 7PM-7AM, please contact night-coverage [email or web address] Password [email or web address] 11/17/2018, 11:45 AM

## 2018-11-18 LAB — COMPREHENSIVE METABOLIC PANEL
ALBUMIN: 3.1 g/dL — AB (ref 3.5–5.0)
ALT: 21 U/L (ref 0–44)
AST: 21 U/L (ref 15–41)
Alkaline Phosphatase: 74 U/L (ref 38–126)
Anion gap: 10 (ref 5–15)
BUN: 18 mg/dL (ref 6–20)
CO2: 20 mmol/L — ABNORMAL LOW (ref 22–32)
Calcium: 8.6 mg/dL — ABNORMAL LOW (ref 8.9–10.3)
Chloride: 108 mmol/L (ref 98–111)
Creatinine, Ser: 1.02 mg/dL — ABNORMAL HIGH (ref 0.44–1.00)
GFR calc Af Amer: >60 mL/min (ref >60)
GFR calc non Af Amer: >60 mL/min (ref >60)
Glucose, Bld: 204 mg/dL — ABNORMAL HIGH (ref 70–99)
POTASSIUM: 4.2 mmol/L (ref 3.5–5.1)
Sodium: 138 mmol/L (ref 135–145)
Total Bilirubin: 0.3 mg/dL (ref 0.3–1.2)
Total Protein: 6.3 g/dL — ABNORMAL LOW (ref 6.5–8.1)

## 2018-11-18 LAB — CBC WITH DIFFERENTIAL/PLATELET
Abs Immature Granulocytes: 0.16 10*3/uL — ABNORMAL HIGH (ref 0.00–0.07)
Basophils Absolute: 0 10*3/uL (ref 0.0–0.1)
Basophils Relative: 0 %
Eosinophils Absolute: 0 10*3/uL (ref 0.0–0.5)
Eosinophils Relative: 0 %
HCT: 41 % (ref 36.0–46.0)
Hemoglobin: 12.2 g/dL (ref 12.0–15.0)
IMMATURE GRANULOCYTES: 1 %
LYMPHS ABS: 1.2 10*3/uL (ref 0.7–4.0)
Lymphocytes Relative: 8 %
MCH: 25.9 pg — ABNORMAL LOW (ref 26.0–34.0)
MCHC: 29.8 g/dL — ABNORMAL LOW (ref 30.0–36.0)
MCV: 87 fL (ref 80.0–100.0)
Monocytes Absolute: 0.4 10*3/uL (ref 0.1–1.0)
Monocytes Relative: 3 %
NEUTROS PCT: 88 %
Neutro Abs: 13.3 10*3/uL — ABNORMAL HIGH (ref 1.7–7.7)
Platelets: 353 10*3/uL (ref 150–400)
RBC: 4.71 MIL/uL (ref 3.87–5.11)
RDW: 17.5 % — ABNORMAL HIGH (ref 11.5–15.5)
WBC: 15.1 10*3/uL — ABNORMAL HIGH (ref 4.0–10.5)
nRBC: 0 % (ref 0.0–0.2)

## 2018-11-18 LAB — PHOSPHORUS: Phosphorus: 3.8 mg/dL (ref 2.5–4.6)

## 2018-11-18 LAB — MAGNESIUM: Magnesium: 1.8 mg/dL (ref 1.7–2.4)

## 2018-11-18 MED ORDER — ALBUTEROL SULFATE HFA 108 (90 BASE) MCG/ACT IN AERS: 2.0000 | INHALATION_SPRAY | Freq: Four times a day (QID) | RESPIRATORY_TRACT | 2 refills | Status: DC | PRN

## 2018-11-18 MED ORDER — BENZONATATE 100 MG PO CAPS: 100.0000 mg | ORAL_CAPSULE | Freq: Three times a day (TID) | ORAL | 0 refills | Status: DC | PRN

## 2018-11-18 MED ORDER — AZITHROMYCIN 250 MG PO TABS: 500.0000 mg | ORAL_TABLET | Freq: Every day | ORAL | Status: DC

## 2018-11-18 MED ORDER — FOLIC ACID 1 MG PO TABS: 1.0000 mg | ORAL_TABLET | Freq: Every day | ORAL | 0 refills | Status: DC

## 2018-11-18 MED ORDER — THIAMINE HCL 100 MG PO TABS: 100.0000 mg | ORAL_TABLET | Freq: Every day | ORAL | 0 refills | Status: DC

## 2018-11-18 MED ORDER — IPRATROPIUM-ALBUTEROL 0.5-2.5 (3) MG/3ML IN SOLN: 3.0000 mL | Freq: Four times a day (QID) | RESPIRATORY_TRACT | 0 refills | Status: DC | PRN

## 2018-11-18 MED ORDER — AZITHROMYCIN 250 MG PO TABS: 500.0000 mg | ORAL_TABLET | Freq: Every day | ORAL | 0 refills | Status: AC

## 2018-11-18 MED ORDER — ADULT MULTIVITAMIN W/MINERALS CH: 1.0000 | ORAL_TABLET | Freq: Every day | ORAL | 0 refills | Status: DC

## 2018-11-18 MED ORDER — GUAIFENESIN ER 600 MG PO TB12: 600.0000 mg | ORAL_TABLET | Freq: Two times a day (BID) | ORAL | 0 refills | Status: AC

## 2018-11-18 MED ORDER — PREDNISONE 10 MG (21) PO TBPK: ORAL_TABLET | ORAL | 0 refills | Status: DC

## 2018-11-18 NOTE — Progress Notes (Signed)
Pt leaving this afternoon with her "significant other". Alert, oriented, and without c/o. Discharge instructions/prescriptions given/explaiined with pt verbalizing understanding. Pt aware to followup and secure a PCP. Pt to leave with Neb machine when [facility].

## 2018-11-18 NOTE — Care Management Note (Signed)
Case Management Note  Patient Details  Name: Emmaline Lifeasiya D Mervine MRN: [id number] Date of Birth: 11/06/1981  Subjective/Objective:   Acute bronchitis                Action/Plan: NCM spoke to pt at bedside. Provided pt with MATCH with $3 copay and once per year utilization. Contacted [facility] for neb machine for home to be delivered to room prior to dc.   Expected Discharge Date:  11/18/18               Expected Discharge Plan:  Home/Self Care  In-House Referral:  NA  Discharge planning Services  CM Consult, Medication Assistance, MATCH Program, Indigent [facility]  Post Acute Care Choice:  NA Choice offered to:  NA  DME Arranged:  Nebulizer machine DME Agency:  [facility]  HH Arranged:  NA HH Agency:  NA  Status of Service:  Completed, signed off  If discussed at Long Length of Stay Meetings, dates discussed:    Additional Comments:  Elliot CousinShavis, Mate Alegria Ellen, RN 11/18/2018, 12:11 PM

## 2018-11-18 NOTE — Progress Notes (Signed)
Pt leaving at this time with her "work letter" and neb machine.

## 2018-11-19 NOTE — Discharge Summary (Signed)
Physician Discharge Summary  SHANDEE JERGENS HYQ:[id number] DOB: 1981-09-09 DOA: 11/14/2018  PCP: Patient, No Pcp Per  Admit date: 11/14/2018 Discharge date: 11/19/2018  Admitted From: Home Disposition: Home  Recommendations for Outpatient Follow-up:  1. Follow up with PCP in 1-2 weeks 2. Have PCP refer to pulmonary for outpatient evaluation 3. Follow up and repeat CXR within 3 weeks 4. Please obtain CMP/CBC, Mag, Phos in one week 5. Please follow up on the following pending results:  Home Health: No Equipment/Devices: Yes DME Nebulizer  Discharge Condition: Stable CODE STATUS: FULL CODE Diet recommendation: Regular Diet  Brief/Interim Summary: The patient is a 37 year old morbidly obese African-American female with a past female history significant for tobacco and alcohol abuse as well as asthma child who presented to the emergency room with increasing shortness of breath with wheezing and productive cough for the last 3 days. She also had associated pleuritic type chest pain on coughing along with abdominal pain. She was found to be hypoxic and wheezing in the ED and chest x-ray showed bronchitic changes.She was admitted for the further management of acute bronchitis and I have adjusted her breathing medications and placed on duo nebs. She is also on budesonide and I also added a flutter valve and incentive spirometer and scheduled guaifenesin. Respiratory virus panel was negative.Steroids were increased due to significant wheezing but patient is no longer hypoxic. She is doing better yesterday but feels here breathing is still not back to baseline.  She is much better and she was evaluated to be placed on a prednisone taper.  She was advised to defer tobacco products and she will need to follow with PCP and pulmonary in outpatient setting if appropriate.  She will continue her azithromycin as well as prednisone taper in the outpatient setting and will be given a nebulizer for  home.  Discharge Diagnoses:  Principal Problem:   Acute bronchitis Active Problems:   Tobacco abuse   Asthma exacerbation  Acute Bronchitis, slowly improving -Patient was still significantly wheezing but this is improved .  Patient was feeling dyspneic and not back to baseline but today is much closer to baseline and improved -IV steroids changed to prednisone taper at discharge -Continue DuoNeb 3 mL every 6 scheduled -Continue with albuterol nebs 2.5 mg every 2 PRN for wheezing -Given IV azithromycin 500 mg p.o. daily and change to p.o. we will continue discharge -Continue with guaifenesin 1200 mg p.o. twice daily along with benzonatate 200 mg p.o. 3 times daily PRN for cough -Continue with incentive spirometer and flutter valve -May have underlying COPD/obesity hypoventilation syndrome and will need outpatient PFTs and formal sleep apnea study with a pulmonary consultation -Smoking cessation counseling given -Continue monitor respiratory status -Patient is not hypoxic on ambulation but still feeling subjectively dyspneic and not back to baseline.  Wheezing is improved -Respiratory virus panel was obtained and was negative -Culture was never sent -Repeat CXR 11/16/18 showed Mild peribronchial thickening, consistent with bronchitic changes. No evidence of lobar consolidation. -Chest x-ray 11/17/2018 showed no acute abnormality with no focal infiltrate or sizable effusion -Ambulatory screen done showed patient did not desaturate but patient still has significant wheezing -She improved significantly and she will need an outpatient pulmonary follow-up as well as a home nebulizer. -We will consult care management for assistance with finding a PCP -Will also need to follow up with Pulmonology at D/C  Tobacco Abuse -Smoking cessation counseling given -Consider adding nicotine patch but will discuss with the patient first  Alcohol Use -  Concern for possible withdrawal she drinks every  other day -Placed on CIWA scale with p.o./IV lorazepam every 6 as needed -Continue with folic acid 1 mg p.o. daily, multivitamin 1 tab p.o. daily, and thiamine 100 mg p.o./IV daily  Hyperglycemia -Setting of IV steroid demargination however she had hyperglycemia even before steroids were administered -Hemoglobin A1c done and was 5.5 -If Blood sugars remain consistently elevated will need to place on sensitive NovoLog sliding scale before meals and at bedtime  Leukocytosis -Likely Reactive in the setting of IV Steroid Demargination -WBC went from 6.2 -> 13.7 -> 16.9 -> 15.1 -Continue to Monitor for S/Sx of Infection -Empiric Abx with Azithromycin -Repeat CBC as an outpatient   Morbid Obesity -Estimated body mass index is 43.12 kg/m as calculated from the following:   Height as of this encounter: 5\' 8"  (1.727 m).   Weight as of this encounter: 128.6 kg. -Weight Loss Counseling Given  Elevated Cr, improved  -Patient's Cr slightly bumped from 1.03 -> 1.11 but has improved to 0.91 and is now 1.02 -Continue to Monitor closely  -Patient's Cr in 2017 was 0.93 -Continue to Monitor Closely and follow as an outpatient   Discharge Instructions  Discharge Instructions    Call MD for:  difficulty breathing, headache or visual disturbances   Complete by:  As directed    Call MD for:  extreme fatigue   Complete by:  As directed    Call MD for:  hives   Complete by:  As directed    Call MD for:  persistant dizziness or light-headedness   Complete by:  As directed    Call MD for:  persistant nausea and vomiting   Complete by:  As directed    Call MD for:  redness, tenderness, or signs of infection (pain, swelling, redness, odor or green/yellow discharge around incision site)   Complete by:  As directed    Call MD for:  severe uncontrolled pain   Complete by:  As directed    Call MD for:  temperature >100.4   Complete by:  As directed    DME Nebulizer/meds   Complete by:  As  directed    Patient needs a nebulizer to treat with the following condition:  Acute bronchitis   Diet - low sodium heart healthy   Complete by:  As directed    Discharge instructions   Complete by:  As directed    You were cared for by a hospitalist during your hospital stay. If you have any questions about your discharge medications or the care you received while you were in the hospital after you are discharged, you can call the unit and ask to speak with the hospitalist on call if the hospitalist that took care of you is not available. Once you are discharged, your primary care physician will handle any further medical issues. Please note that NO REFILLS for any discharge medications will be authorized once you are discharged, as it is imperative that you return to your primary care physician (or establish a relationship with a primary care physician if you do not have one) for your aftercare needs so that they can reassess your need for medications and monitor your lab values.  Follow up with PCP and outpatient Pulmonary. Take all medications as prescribed. If symptoms change or worsen please return to the ED for evaluation   Increase activity slowly   Complete by:  As directed      Allergies as of 11/18/2018  No Known Allergies     Medication List    STOP taking these medications   naproxen 500 MG tablet Commonly known as:  NAPROSYN     TAKE these medications   albuterol 108 (90 Base) MCG/ACT inhaler Commonly known as:  PROVENTIL HFA;VENTOLIN HFA Inhale 2 puffs into the lungs every 6 (six) hours as needed for wheezing or shortness of breath.   azithromycin 250 MG tablet Commonly known as:  ZITHROMAX Take 2 tablets (500 mg total) by mouth daily for 2 days.   benzonatate 100 MG capsule Commonly known as:  TESSALON Take 1 capsule (100 mg total) by mouth 3 (three) times daily as needed for cough.   folic acid 1 MG tablet Commonly known as:  FOLVITE Take 1 tablet (1 mg total)  by mouth daily.   guaiFENesin 600 MG 12 hr tablet Commonly known as:  MUCINEX Take 1 tablet (600 mg total) by mouth 2 (two) times daily for 5 days.   ipratropium-albuterol 0.5-2.5 (3) MG/3ML Soln Commonly known as:  DUONEB Take 3 mLs by nebulization every 6 (six) hours as needed.   multivitamin with minerals Tabs tablet Take 1 tablet by mouth daily.   oxyCODONE-acetaminophen 5-325 MG tablet Commonly known as:  PERCOCET/ROXICET Take 1 tablet by mouth every 8 (eight) hours as needed for severe pain.   predniSONE 10 MG (21) Tbpk tablet Commonly known as:  STERAPRED UNI-PAK 21 TAB Take 6 pills Day 1, 5 Pills Day 2, 4 Pills Day 3, 3 Pills Day 4, 2 Pills Day 5, 1 Pill Day 6 and Stop Day 7   thiamine 100 MG tablet Take 1 tablet (100 mg total) by mouth daily.   traMADol 50 MG tablet Commonly known as:  ULTRAM Take 1 tablet (50 mg total) by mouth every 6 (six) hours as needed.            Durable Medical Equipment  (From admission, onward)         Start     Ordered   11/18/18 0000  DME Nebulizer/meds    Question:  Patient needs a nebulizer to treat with the following condition  Answer:  Acute bronchitis   11/18/18 0935         Follow-up Information    [facility]. Schedule an appointment as soon as possible for a visit.   Why:  call day after discharge at 9 am to make a hospital follow appointment Contact information: [address] [place] [phone]       Nyoka CowdenWert, Michael B, MD. Call.   Specialty:  Pulmonary Disease Why:  Follow up for Acute Broncitis and outpatient PFT's and Sleep Study Contact information: [address] [place] [address] [phone]        [facility] - Dme Follow up.   Why:  will deliver nebulizer machine to room Contact information: [address] [place] [address] [phone]          No Known  Allergies  Consultations:  None  Procedures/Studies: Dg Chest 2 View  Result Date: 11/14/2018 CLINICAL DATA:  Cough EXAM: CHEST - 2 VIEW COMPARISON:  None FINDINGS: Peribronchial thickening. Heart and mediastinal contours are within normal limits. No focal opacities or effusions. No acute bony abnormality. IMPRESSION: Bronchitic changes. Electronically Signed   By: Charlett NoseKevin  Dover M.D.   On: 11/14/2018 21:17   Dg Chest Port 1 View  Result Date: 11/17/2018 CLINICAL DATA:  Bronchitis EXAM: PORTABLE CHEST 1 VIEW COMPARISON:  11/16/2018 FINDINGS: Cardiac shadows within normal limits. The lungs are well aerated bilaterally. No focal infiltrate or sizable effusion is seen. No bony abnormality is noted. IMPRESSION: No acute abnormality noted. Electronically Signed   By: Alcide Clever M.D.   On: 11/17/2018 06:15   Dg Chest Port 1 View  Result Date: 11/16/2018 CLINICAL DATA:  Shortness of breath. EXAM: PORTABLE CHEST 1 VIEW COMPARISON:  11/14/2018 FINDINGS: Cardiomediastinal silhouette is normal. Mediastinal contours appear intact. There is no evidence of focal airspace consolidation, pleural effusion or pneumothorax. Mild peribronchial thickening again noted. Noted. Osseous structures are without acute abnormality. Soft tissues are grossly normal. IMPRESSION: Mild peribronchial thickening, consistent with bronchitic changes. No evidence of lobar consolidation. Electronically Signed   By: Ted Mcalpine M.D.   On: 11/16/2018 08:41     Subjective: Seen and examined at bedside and was feeling much better and back to baseline.  No chest pain, lightheadedness or dizziness.  States that she is still coughing but not as bad and not bringing up much sputum now.  No other concerns expressed at this time and feels well enough to go home.  She understands that she will need follow-up with PCP and pulmonology in outpatient setting.  Discharge Exam: Vitals:   11/18/18 0551 11/18/18 0732  BP: (!) 151/96    Pulse: 87   Resp: 20   Temp: (!) 97.3 F (36.3 C)   SpO2: 97% 96%   Vitals:   11/17/18 2007 11/17/18 2333 11/18/18 0551 11/18/18 0732  BP:  (!) 145/94 (!) 151/96   Pulse:  91 87   Resp:  20 20   Temp:  97.8 F (36.6 C) (!) 97.3 F (36.3 C)   TempSrc:  Oral Oral   SpO2: 98% 96% 97% 96%  Weight:      Height:       General: Pt is alert, awake, not in acute distress Cardiovascular: RRR, S1/S2 +, no rubs, no gallops Respiratory: Diminished bilaterally with mild wheezing, no rhonchi Abdominal: Soft, NT, distended secondary body habitus, bowel sounds + Extremities: no LE edema, no cyanosis  The results of significant diagnostics from this hospitalization (including imaging, microbiology, ancillary and laboratory) are listed below for reference.    Microbiology: Recent Results (from the past 240 hour(s))  Respiratory Panel by PCR     Status: None   Collection Time: 11/15/18 12:13 PM  Result Value Ref Range Status   Adenovirus NOT DETECTED NOT DETECTED Final   Coronavirus 229E NOT DETECTED NOT DETECTED Final   Coronavirus HKU1 NOT DETECTED NOT DETECTED Final   Coronavirus NL63 NOT DETECTED NOT DETECTED Final   Coronavirus OC43 NOT DETECTED NOT DETECTED Final   Metapneumovirus NOT DETECTED NOT DETECTED Final   Rhinovirus / Enterovirus NOT DETECTED NOT DETECTED Final   Influenza A NOT DETECTED NOT DETECTED Final   Influenza B NOT DETECTED NOT DETECTED Final   Parainfluenza Virus 1 NOT DETECTED NOT DETECTED Final   Parainfluenza Virus 2 NOT DETECTED NOT DETECTED Final   Parainfluenza Virus 3 NOT DETECTED NOT DETECTED Final   Parainfluenza Virus 4 NOT DETECTED NOT DETECTED Final   Respiratory Syncytial Virus NOT DETECTED NOT DETECTED Final   Bordetella pertussis NOT DETECTED NOT DETECTED Final   Chlamydophila pneumoniae NOT DETECTED NOT DETECTED Final   Mycoplasma pneumoniae NOT DETECTED NOT DETECTED Final    Comment: Performed at [facility], [address],  [place], [place] [address]    Labs:  BNP (last 3 results) No results for input(s): BNP in the last 8760 hours. Basic Metabolic Panel: Recent Labs  Lab 11/15/18 0458 11/16/18 0640 11/17/18 0757 11/18/18 0616  NA 137 136 137 138  K 3.5 4.3 4.2 4.2  CL 104 106 107 108  CO2 24 23 21* 20*  GLUCOSE 153* 192* 137* 204*  BUN 13 16 19 18   CREATININE 1.03* 1.13* 0.91 1.02*  CALCIUM 9.1 9.1 8.9 8.6*  MG  --  1.9 1.8 1.8  PHOS  --  3.1 4.1 3.8   Liver Function Tests: Recent Labs  Lab 11/15/18 0724 11/16/18 0640 11/17/18 0757 11/18/18 0616  AST 25 20 15 21   ALT 23 19 19 21   ALKPHOS 80 82 76 74  BILITOT 0.6 0.4 0.3 0.3  PROT 7.4 7.0 6.8 6.3*  ALBUMIN 3.7 3.6 3.3* 3.1*   No results for input(s): LIPASE, AMYLASE in the last 168 hours. No results for input(s): AMMONIA in the last 168 hours. CBC: Recent Labs  Lab 11/15/18 0458 11/16/18 0640 11/17/18 0757 11/18/18 0616  WBC 6.2 13.7* 16.9* 15.1*  NEUTROABS 5.6 12.0* 15.5* 13.3*  HGB 13.0 12.5 12.4 12.2  HCT 41.8 40.6 40.5 41.0  MCV 82.0 85.5 83.3 87.0  PLT 345 340 356 353   Cardiac Enzymes: Recent Labs  Lab 11/15/18 0724  TROPONINI <0.03   BNP: Invalid input(s): POCBNP CBG: No results for input(s): GLUCAP in the last 168 hours. D-Dimer No results for input(s): DDIMER in the last 72 hours. Hgb A1c No results for input(s): HGBA1C in the last 72 hours. Lipid Profile No results for input(s): CHOL, HDL, LDLCALC, TRIG, CHOLHDL, LDLDIRECT in the last 72 hours. Thyroid function studies No results for input(s): TSH, T4TOTAL, T3FREE, THYROIDAB in the last 72 hours.  Invalid input(s): FREET3 Anemia work up No results for input(s): VITAMINB12, FOLATE, FERRITIN, TIBC, IRON, RETICCTPCT in the last 72 hours. Urinalysis    Component Value Date/Time   COLORURINE YELLOW 10/07/2016 1455   APPEARANCEUR HAZY (A) 10/07/2016 1455   LABSPEC 1.020 10/07/2016 1455   PHURINE 6.0 10/07/2016 1455   GLUCOSEU NEGATIVE 10/07/2016 1455    HGBUR LARGE (A) 10/07/2016 1455   BILIRUBINUR NEGATIVE 10/07/2016 1455   KETONESUR NEGATIVE 10/07/2016 1455   PROTEINUR NEGATIVE 10/07/2016 1455   NITRITE NEGATIVE 10/07/2016 1455   LEUKOCYTESUR NEGATIVE 10/07/2016 1455   Sepsis Labs Invalid input(s): PROCALCITONIN,  WBC,  LACTICIDVEN Microbiology Recent Results (from the past 240 hour(s))  Respiratory Panel by PCR     Status: None   Collection Time: 11/15/18 12:13 PM  Result Value Ref Range Status   Adenovirus NOT DETECTED NOT DETECTED Final   Coronavirus 229E NOT DETECTED NOT DETECTED Final   Coronavirus HKU1 NOT DETECTED NOT DETECTED Final   Coronavirus NL63 NOT DETECTED NOT DETECTED Final   Coronavirus OC43 NOT DETECTED NOT DETECTED Final   Metapneumovirus NOT DETECTED NOT DETECTED Final   Rhinovirus / Enterovirus NOT DETECTED NOT DETECTED Final   Influenza A NOT DETECTED NOT DETECTED Final   Influenza B NOT DETECTED NOT DETECTED Final   Parainfluenza Virus 1 NOT DETECTED NOT DETECTED Final   Parainfluenza Virus 2 NOT DETECTED NOT DETECTED Final   Parainfluenza Virus 3 NOT DETECTED NOT DETECTED Final   Parainfluenza Virus 4 NOT DETECTED NOT DETECTED Final   Respiratory Syncytial Virus NOT DETECTED NOT DETECTED Final   Bordetella pertussis NOT DETECTED NOT DETECTED Final   Chlamydophila pneumoniae NOT DETECTED NOT DETECTED Final   Mycoplasma pneumoniae NOT DETECTED NOT  DETECTED Final    Comment: Performed at [facility], [address] [place], [place] [address]   Time coordinating discharge: 35 minutes  SIGNED:  Merlene Laughter, DO [facility] 11/19/2018, 9:08 PM Pager is on AMION  If 7PM-7AM, please contact night-coverage [email or web address] Password [email or web address]

## 2019-02-22 ENCOUNTER — Emergency Department (HOSPITAL_COMMUNITY): Payer: Self-pay

## 2019-02-22 ENCOUNTER — Inpatient Hospital Stay (HOSPITAL_COMMUNITY)
Admission: EM | Admit: 2019-02-22 | Discharge: 2019-02-25 | DRG: 202 | Disposition: A | Discharge status: Home / Self Care | Payer: Self-pay | Attending: Internal Medicine | Admitting: Internal Medicine

## 2019-02-22 ENCOUNTER — Encounter (HOSPITAL_COMMUNITY): Payer: Self-pay

## 2019-02-22 ENCOUNTER — Other Ambulatory Visit: Payer: Self-pay

## 2019-02-22 DIAGNOSIS — D72829 Elevated white blood cell count, unspecified: Secondary | ICD-10-CM

## 2019-02-22 DIAGNOSIS — J45901 Unspecified asthma with (acute) exacerbation: Secondary | ICD-10-CM | POA: Diagnosis present

## 2019-02-22 DIAGNOSIS — J4542 Moderate persistent asthma with status asthmaticus: Principal | ICD-10-CM | POA: Diagnosis present

## 2019-02-22 DIAGNOSIS — F17210 Nicotine dependence, cigarettes, uncomplicated: Secondary | ICD-10-CM | POA: Diagnosis present

## 2019-02-22 DIAGNOSIS — Z6841 Body Mass Index (BMI) 40.0 and over, adult: Secondary | ICD-10-CM

## 2019-02-22 DIAGNOSIS — Z79899 Other long term (current) drug therapy: Secondary | ICD-10-CM

## 2019-02-22 HISTORY — DX: Unspecified asthma, uncomplicated: J45.909

## 2019-02-22 LAB — I-STAT BETA HCG BLOOD, ED (NOT ORDERABLE)

## 2019-02-22 LAB — CBC WITH DIFFERENTIAL/PLATELET
ABS IMMATURE GRANULOCYTES: 0.04 10*3/uL (ref 0.00–0.07)
Basophils Absolute: 0.1 10*3/uL (ref 0.0–0.1)
Basophils Relative: 0 %
Eosinophils Absolute: 0.3 10*3/uL (ref 0.0–0.5)
Eosinophils Relative: 3 %
HCT: 44.3 % (ref 36.0–46.0)
Hemoglobin: 13.2 g/dL (ref 12.0–15.0)
Immature Granulocytes: 0 %
LYMPHS PCT: 23 %
Lymphs Abs: 2.6 10*3/uL (ref 0.7–4.0)
MCH: 26.2 pg (ref 26.0–34.0)
MCHC: 29.8 g/dL — ABNORMAL LOW (ref 30.0–36.0)
MCV: 87.9 fL (ref 80.0–100.0)
Monocytes Absolute: 1 10*3/uL (ref 0.1–1.0)
Monocytes Relative: 9 %
Neutro Abs: 7.6 10*3/uL (ref 1.7–7.7)
Neutrophils Relative %: 65 %
Platelets: 325 10*3/uL (ref 150–400)
RBC: 5.04 MIL/uL (ref 3.87–5.11)
RDW: 17.8 % — ABNORMAL HIGH (ref 11.5–15.5)
WBC: 11.6 10*3/uL — ABNORMAL HIGH (ref 4.0–10.5)
nRBC: 0 % (ref 0.0–0.2)

## 2019-02-22 LAB — BASIC METABOLIC PANEL
Anion gap: 8 (ref 5–15)
BUN: 8 mg/dL (ref 6–20)
CO2: 25 mmol/L (ref 22–32)
Calcium: 9 mg/dL (ref 8.9–10.3)
Chloride: 105 mmol/L (ref 98–111)
Creatinine, Ser: 1 mg/dL (ref 0.44–1.00)
GFR calc Af Amer: >60 mL/min (ref >60)
GFR calc non Af Amer: >60 mL/min (ref >60)
Glucose, Bld: 101 mg/dL — ABNORMAL HIGH (ref 70–99)
Potassium: 4.6 mmol/L (ref 3.5–5.1)
Sodium: 138 mmol/L (ref 135–145)

## 2019-02-22 MED ORDER — ALBUTEROL SULFATE (2.5 MG/3ML) 0.083% IN NEBU
5.0000 mg | INHALATION_SOLUTION | Freq: Once | RESPIRATORY_TRACT | Status: AC
  Administered 2019-02-22: 5 mg via RESPIRATORY_TRACT
  Filled 2019-02-22: qty 6

## 2019-02-22 MED ORDER — IPRATROPIUM BROMIDE 0.02 % IN SOLN
0.5000 mg | Freq: Once | RESPIRATORY_TRACT | Status: AC
  Administered 2019-02-22: 0.5 mg via RESPIRATORY_TRACT
  Filled 2019-02-22: qty 2.5

## 2019-02-22 MED ORDER — ACETAMINOPHEN 650 MG RE SUPP: 650.0000 mg | Freq: Four times a day (QID) | RECTAL | Status: DC | PRN

## 2019-02-22 MED ORDER — IPRATROPIUM BROMIDE 0.02 % IN SOLN: 0.5000 mg | Freq: Three times a day (TID) | RESPIRATORY_TRACT | Status: DC

## 2019-02-22 MED ORDER — ALBUTEROL (5 MG/ML) CONTINUOUS INHALATION SOLN
10.0000 mg/h | INHALATION_SOLUTION | RESPIRATORY_TRACT | Status: DC
  Administered 2019-02-22: 10 mg/h via RESPIRATORY_TRACT
  Filled 2019-02-22: qty 20

## 2019-02-22 MED ORDER — LEVALBUTEROL HCL 0.63 MG/3ML IN NEBU: 0.6300 mg | INHALATION_SOLUTION | Freq: Three times a day (TID) | RESPIRATORY_TRACT | Status: DC

## 2019-02-22 MED ORDER — METHYLPREDNISOLONE SODIUM SUCC 125 MG IJ SOLR
60.0000 mg | Freq: Three times a day (TID) | INTRAMUSCULAR | Status: DC
  Administered 2019-02-23 – 2019-02-25 (×8): 60 mg via INTRAVENOUS
  Filled 2019-02-22 (×9): qty 2

## 2019-02-22 MED ORDER — METHYLPREDNISOLONE SODIUM SUCC 125 MG IJ SOLR
125.0000 mg | Freq: Once | INTRAMUSCULAR | Status: AC
  Administered 2019-02-22: 125 mg via INTRAVENOUS
  Filled 2019-02-22: qty 2

## 2019-02-22 MED ORDER — MAGNESIUM SULFATE 2 GM/50ML IV SOLN
2.0000 g | Freq: Once | INTRAVENOUS | Status: AC
  Administered 2019-02-22: 2 g via INTRAVENOUS
  Filled 2019-02-22: qty 50

## 2019-02-22 MED ORDER — LEVALBUTEROL HCL 0.63 MG/3ML IN NEBU
0.6300 mg | INHALATION_SOLUTION | Freq: Three times a day (TID) | RESPIRATORY_TRACT | Status: DC
  Administered 2019-02-23 – 2019-02-25 (×7): 0.63 mg via RESPIRATORY_TRACT
  Filled 2019-02-22 (×8): qty 3

## 2019-02-22 MED ORDER — ACETAMINOPHEN 325 MG PO TABS
650.0000 mg | ORAL_TABLET | Freq: Four times a day (QID) | ORAL | Status: DC | PRN
  Administered 2019-02-23 – 2019-02-24 (×3): 650 mg via ORAL
  Filled 2019-02-22 (×3): qty 2

## 2019-02-22 MED ORDER — ALBUTEROL SULFATE (2.5 MG/3ML) 0.083% IN NEBU: 2.5000 mg | INHALATION_SOLUTION | RESPIRATORY_TRACT | Status: DC | PRN

## 2019-02-22 MED ORDER — IPRATROPIUM BROMIDE 0.02 % IN SOLN
0.5000 mg | Freq: Three times a day (TID) | RESPIRATORY_TRACT | Status: DC
  Administered 2019-02-23 – 2019-02-25 (×7): 0.5 mg via RESPIRATORY_TRACT
  Filled 2019-02-22 (×8): qty 2.5

## 2019-02-22 MED ORDER — BENZONATATE 100 MG PO CAPS
200.0000 mg | ORAL_CAPSULE | Freq: Three times a day (TID) | ORAL | Status: DC | PRN
  Administered 2019-02-22 – 2019-02-23 (×2): 200 mg via ORAL
  Filled 2019-02-22 (×2): qty 2

## 2019-02-22 MED ORDER — ENOXAPARIN SODIUM 40 MG/0.4ML ~~LOC~~ SOLN
40.0000 mg | Freq: Every day | SUBCUTANEOUS | Status: DC
  Administered 2019-02-22 – 2019-02-24 (×3): 40 mg via SUBCUTANEOUS
  Filled 2019-02-22 (×3): qty 0.4

## 2019-02-22 NOTE — ED Provider Notes (Signed)
[facility]-EMERGENCY DEPT Provider Note   CSN: [id number] Arrival date & time: 02/22/19  1417    History   Chief Complaint Chief Complaint  Patient presents with  . Shortness of Breath    HPI Rebekah Evans is a 38 y.o. female.       Shortness of Breath  Severity:  Moderate Onset quality:  Gradual Duration:  3 days Timing:  Constant Progression:  Worsening Chronicity:  Recurrent Context: URI   Relieved by:  Nothing Worsened by:  Coughing, activity, deep breathing and exertion Ineffective treatments:  Inhaler Associated symptoms: cough (Painful cough), fever (subjective) and wheezing   Associated symptoms: no abdominal pain, no chest pain, no claudication, no diaphoresis, no ear pain, no headaches, no hemoptysis, no neck pain, no PND, no rash, no sore throat, no sputum production, no syncope, no swollen glands and no vomiting   Risk factors: obesity   Risk factors: no family hx of DVT, no hx of cancer, no hx of PE/DVT, no oral contraceptive use, no prolonged immobilization and no recent surgery  Tobacco use: QUit 11/2018.   Wheezing  Severity:  Severe Severity compared to prior episodes:  Similar Onset quality:  Gradual Duration:  3 days Timing:  Constant Progression:  Worsening Context comment:  URI Relieved by:  Nothing Worsened by:  Activity Ineffective treatments:  Beta-agonist inhaler Associated symptoms: chest tightness, cough (Painful cough), fever (subjective), rhinorrhea and shortness of breath   Associated symptoms: no chest pain, no ear pain, no foot swelling, no headaches, no orthopnea, no PND, no rash, no sore throat, no sputum production, no stridor and no swollen glands   Risk factors: prior hospitalizations   Risk factors: no prior intubations     Past Medical History:  Diagnosis Date  . Asthma   . Medical history non-contributory     Patient Active Problem List   Diagnosis Date Noted  . Acute asthma exacerbation  02/22/2019  . Asthma exacerbation 11/16/2018  . Acute bronchitis 11/15/2018  . Tobacco abuse 11/15/2018    Past Surgical History:  Procedure Laterality Date  . NO PAST SURGERIES       OB History    Gravida  5   Para      Term      Preterm      AB      Living  5     SAB      TAB      Ectopic      Multiple      Live Births               Home Medications    Prior to Admission medications   Medication Sig Start Date End Date Taking? Authorizing Provider  albuterol (PROVENTIL HFA;VENTOLIN HFA) 108 (90 Base) MCG/ACT inhaler Inhale 2 puffs into the lungs every 6 (six) hours as needed for wheezing or shortness of breath. 11/18/18  Yes Sheikh, Omair Latif, DO  benzonatate (TESSALON) 100 MG capsule Take 1 capsule (100 mg total) by mouth 3 (three) times daily as needed for cough. Patient not taking: Reported on 02/22/2019 11/18/18   Marguerita Merles Latif, DO  folic acid (FOLVITE) 1 MG tablet Take 1 tablet (1 mg total) by mouth daily. Patient not taking: Reported on 02/22/2019 11/18/18   Marguerita Merles Latif, DO  ipratropium-albuterol (DUONEB) 0.5-2.5 (3) MG/3ML SOLN Take 3 mLs by nebulization every 6 (six) hours as needed. Patient not taking: Reported on 02/22/2019 11/18/18   Merlene Laughter, DO  Multiple Vitamin (MULTIVITAMIN WITH MINERALS) TABS tablet Take 1 tablet by mouth daily. Patient not taking: Reported on 02/22/2019 11/18/18   Marguerita Merles Latif, DO  oxyCODONE-acetaminophen (PERCOCET/ROXICET) 5-325 MG tablet Take 1 tablet by mouth every 8 (eight) hours as needed for severe pain. Patient not taking: Reported on 11/15/2018 11/18/16   Audry Pili, PA-C  predniSONE (STERAPRED UNI-PAK 21 TAB) 10 MG (21) TBPK tablet Take 6 pills Day 1, 5 Pills Day 2, 4 Pills Day 3, 3 Pills Day 4, 2 Pills Day 5, 1 Pill Day 6 and Stop Day 7 Patient not taking: Reported on 02/22/2019 11/18/18   Marguerita Merles Latif, DO  thiamine 100 MG tablet Take 1 tablet (100 mg total) by mouth daily. Patient  not taking: Reported on 02/22/2019 11/18/18   Marguerita Merles Latif, DO  traMADol (ULTRAM) 50 MG tablet Take 1 tablet (50 mg total) by mouth every 6 (six) hours as needed. Patient not taking: Reported on 11/15/2018 04/23/18   Janne Napoleon, NP    Family History No family history on file.  Social History Social History   Tobacco Use  . Smoking status: Current Every Day Smoker    Packs/day: 0.50  . Smokeless tobacco: Current User  Substance Use Topics  . Alcohol use: Yes    Comment: Social  . Drug use: No     Allergies   Patient has no known allergies.   Review of Systems Review of Systems  Constitutional: Positive for fever (subjective). Negative for diaphoresis.  HENT: Positive for rhinorrhea. Negative for ear pain and sore throat.   Eyes: Negative.   Respiratory: Positive for cough (Painful cough), chest tightness, shortness of breath and wheezing. Negative for hemoptysis, sputum production and stridor.   Cardiovascular: Negative for chest pain, orthopnea, claudication, syncope and PND.  Gastrointestinal: Negative for abdominal pain and vomiting.  Genitourinary: Negative.   Musculoskeletal: Negative.  Negative for neck pain.  Skin: Negative.  Negative for rash.  Neurological: Negative for headaches.  All other systems reviewed and are negative.    Physical Exam Updated Vital Signs BP 138/83 (BP Location: Left Arm)   Pulse (!) 120   Temp 98.2 F (36.8 C) (Oral)   Resp 20   Ht 5\' 8"  (1.727 m)   Wt 122.5 kg   LMP 01/14/2019   SpO2 97%   BMI 41.05 kg/m   Physical Exam Vitals signs and nursing note reviewed.  Constitutional:      General: She is not in acute distress.    Appearance: She is well-developed. She is not diaphoretic.  HENT:     Head: Normocephalic and atraumatic.  Eyes:     General: No scleral icterus.    Conjunctiva/sclera: Conjunctivae normal.  Neck:     Musculoskeletal: Normal range of motion.  Cardiovascular:     Rate and Rhythm: Normal rate  and regular rhythm.     Heart sounds: Normal heart sounds. No murmur. No friction rub. No gallop.   Pulmonary:     Effort: Accessory muscle usage and prolonged expiration present. No respiratory distress.     Breath sounds: Decreased air movement present. Examination of the right-upper field reveals wheezing. Examination of the left-upper field reveals wheezing. Examination of the right-middle field reveals wheezing. Examination of the left-middle field reveals wheezing. Examination of the right-lower field reveals wheezing. Examination of the left-lower field reveals wheezing. Wheezing present.  Abdominal:     General: Bowel sounds are normal. There is no distension.     Palpations: Abdomen  is soft. There is no mass.     Tenderness: There is no abdominal tenderness. There is no guarding.  Skin:    General: Skin is warm and dry.  Neurological:     Mental Status: She is alert and oriented to person, place, and time.  Psychiatric:        Behavior: Behavior normal.      ED Treatments / Results  Labs (all labs ordered are listed, but only abnormal results are displayed) Labs Reviewed  CBC WITH DIFFERENTIAL/PLATELET - Abnormal; Notable for the following components:      Result Value   WBC 11.6 (*)    MCHC 29.8 (*)    RDW 17.8 (*)    All other components within normal limits  BASIC METABOLIC PANEL - Abnormal; Notable for the following components:   Glucose, Bld 101 (*)    All other components within normal limits  CBC  I-STAT BETA HCG BLOOD, ED ([facility], [facility], [facility] ONLY)  I-STAT BETA HCG BLOOD, ED (NOT ORDERABLE)    EKG EKG Interpretation  Date/Time:  Thursday February 22 2019 14:45:14 EDT Ventricular Rate:  90 PR Interval:    QRS Duration: 69 QT Interval:  335 QTC Calculation: 410 R Axis:   56 Text Interpretation:  Sinus rhythm No significant change since last tracing Confirmed by Melene Plan [phone]) on 02/22/2019 9:37:43 PM   Radiology Dg Chest 2 View  Result Date: 02/22/2019  CLINICAL DATA:  Shortness of breath, productive cough. EXAM: CHEST - 2 VIEW COMPARISON:  Radiograph of November 17, 2018. FINDINGS: The heart size and mediastinal contours are within normal limits. Both lungs are clear. No pneumothorax or pleural effusion is noted. The visualized skeletal structures are unremarkable. IMPRESSION: No active cardiopulmonary disease. Electronically Signed   By: Lupita Raider, M.D.   On: 02/22/2019 15:04    Procedures Procedures (including critical care time)  Medications Ordered in ED Medications  benzonatate (TESSALON) capsule 200 mg (has no administration in time range)  methylPREDNISolone sodium succinate (SOLU-MEDROL) 125 mg/2 mL injection 60 mg (has no administration in time range)  levalbuterol (XOPENEX) nebulizer solution 0.63 mg (has no administration in time range)  ipratropium (ATROVENT) nebulizer solution 0.5 mg (has no administration in time range)  enoxaparin (LOVENOX) injection 40 mg (has no administration in time range)  acetaminophen (TYLENOL) tablet 650 mg (has no administration in time range)    Or  acetaminophen (TYLENOL) suppository 650 mg (has no administration in time range)  albuterol (PROVENTIL) (2.5 MG/3ML) 0.083% nebulizer solution 2.5 mg (has no administration in time range)  albuterol (PROVENTIL) (2.5 MG/3ML) 0.083% nebulizer solution 5 mg (5 mg Nebulization Given 02/22/19 1429)  magnesium sulfate IVPB 2 g 50 mL (0 g Intravenous Stopped 02/22/19 1817)  ipratropium (ATROVENT) nebulizer solution 0.5 mg (0.5 mg Nebulization Given 02/22/19 1737)  methylPREDNISolone sodium succinate (SOLU-MEDROL) 125 mg/2 mL injection 125 mg (125 mg Intravenous Given 02/22/19 1737)  albuterol (PROVENTIL) (2.5 MG/3ML) 0.083% nebulizer solution 5 mg (5 mg Nebulization Given 02/22/19 1737)     Initial Impression / Assessment and Plan / ED Course  I have reviewed the triage vital signs and the nursing notes.  Pertinent labs & imaging results that were  available during my care of the patient were reviewed by me and considered in my medical decision making (see chart for details).        Patient here with URI symptoms, significant wheezing, oxygen saturations are about 93% on room air.  She was given a DuoNeb  treatment at triage with minimal improvement.  She was given 5 mg of albuterol and 5 mg of Atrovent.  When I rechecked the patient she still felt tight and had continued wheezing.  Patient was given an hour-long nebulizer treatment.  On recheck she continued to be extremely tight, poor air movement and oxygen saturations continue to be about 93% on room air at my last evaluation.  Patient in status asthmaticus.  She will need admission.  She is slightly elevated white blood cell count.  Her BMP is without significant abnormality.  Negative pregnancy test.  Chest x-ray shows no consolidation or other acute abnormality.  I personally reviewed the PA and lateral chest film and agree with radiologic interpretation.  Final Clinical Impressions(s) / ED Diagnoses   Final diagnoses:  Moderate persistent asthma with status asthmaticus    ED Discharge Orders    None       Arthor Captain, PA-C 02/22/19 2308    Melene Plan, DO 02/24/19 1506

## 2019-02-22 NOTE — ED Notes (Signed)
ED TO INPATIENT HANDOFF REPORT  ED Nurse Name and Phone #: Shalom RN [phone]  S Name/Age/Gender Rebekah Evans 38 y.o. female Room/Bed: WA22/WA22  Code Status   Code Status: Prior  Home/SNF/Other Home Patient oriented to: self, place, time and situation Is this baseline? Yes   Triage Complete: Triage complete  Chief Complaint Cough  Triage Note Pt states she has had SHOB since yesterday. Pt states she has also had a productive cough. Hx of asthma.   Allergies No Known Allergies  Level of Care/Admitting Diagnosis ED Disposition    ED Disposition Condition Comment   Admit  Hospital Area: [facility] [[id number]]  Level of Care: Telemetry [5]  Admit to tele based on following criteria: Other see comments  Comments: tachycardia  Diagnosis: Acute asthma exacerbation [898421]  Admitting Physician: John Giovanni [[id number]]  Attending Physician: John Giovanni [[id number]]  PT Class (Do Not Modify): Observation [104]  PT Acc Code (Do Not Modify): Observation [10022]       B Medical/Surgery History Past Medical History:  Diagnosis Date  . Asthma   . Medical history non-contributory    Past Surgical History:  Procedure Laterality Date  . NO PAST SURGERIES       A IV Location/Drains/Wounds Patient Lines/Drains/Airways Status   Active Line/Drains/Airways    Name:   Placement date:   Placement time:   Site:   Days:   Peripheral IV 02/22/19 Right Antecubital   02/22/19    1736    Antecubital   less than 1          Intake/Output Last 24 hours No intake or output data in the 24 hours ending 02/22/19 2203  Labs/Imaging Results for orders placed or performed during the hospital encounter of 02/22/19 (from the past 48 hour(s))  I-Stat beta hCG blood, ED     Status: None   Collection Time: 02/22/19  5:49 PM  Result Value Ref Range   I-stat hCG, quantitative <5.0 <5 mIU/mL   Comment 3            Comment:   GEST. AGE      CONC.  (mIU/mL)    <=1 WEEK        5 - 50     2 WEEKS       50 - 500     3 WEEKS       100 - 10,000     4 WEEKS     1,000 - 30,000        FEMALE AND NON-PREGNANT FEMALE:     LESS THAN 5 mIU/mL   CBC with Differential/Platelet     Status: Abnormal   Collection Time: 02/22/19  5:50 PM  Result Value Ref Range   WBC 11.6 (H) 4.0 - 10.5 K/uL   RBC 5.04 3.87 - 5.11 MIL/uL   Hemoglobin 13.2 12.0 - 15.0 g/dL   HCT 36.6 81.5 - 94.7 %   MCV 87.9 80.0 - 100.0 fL   MCH 26.2 26.0 - 34.0 pg   MCHC 29.8 (L) 30.0 - 36.0 g/dL   RDW 07.6 (H) 15.1 - 83.4 %   Platelets 325 150 - 400 K/uL   nRBC 0.0 0.0 - 0.2 %   Neutrophils Relative % 65 %   Neutro Abs 7.6 1.7 - 7.7 K/uL   Lymphocytes Relative 23 %   Lymphs Abs 2.6 0.7 - 4.0 K/uL   Monocytes Relative 9 %   Monocytes Absolute 1.0 0.1 - 1.0 K/uL  Eosinophils Relative 3 %   Eosinophils Absolute 0.3 0.0 - 0.5 K/uL   Basophils Relative 0 %   Basophils Absolute 0.1 0.0 - 0.1 K/uL   Immature Granulocytes 0 %   Abs Immature Granulocytes 0.04 0.00 - 0.07 K/uL    Comment: Performed at [facility], [address], [place], [place] [address]  Basic metabolic panel     Status: Abnormal   Collection Time: 02/22/19  5:50 PM  Result Value Ref Range   Sodium 138 135 - 145 mmol/L   Potassium 4.6 3.5 - 5.1 mmol/L   Chloride 105 98 - 111 mmol/L   CO2 25 22 - 32 mmol/L   Glucose, Bld 101 (H) 70 - 99 mg/dL   BUN 8 6 - 20 mg/dL   Creatinine, Ser 3.70 0.44 - 1.00 mg/dL   Calcium 9.0 8.9 - 48.8 mg/dL   GFR calc non Af Amer >60 >60 mL/min   GFR calc Af Amer >60 >60 mL/min   Anion gap 8 5 - 15    Comment: Performed at [facility], [address] [place], [place] [address]   Dg Chest 2 View  Result Date: 02/22/2019 CLINICAL DATA:  Shortness of breath, productive cough. EXAM: CHEST - 2 VIEW COMPARISON:  Radiograph of November 17, 2018. FINDINGS: The heart size and mediastinal contours are within normal limits. Both lungs are clear. No  pneumothorax or pleural effusion is noted. The visualized skeletal structures are unremarkable. IMPRESSION: No active cardiopulmonary disease. Electronically Signed   By: Lupita Raider, M.D.   On: 02/22/2019 15:04    Pending Labs Unresulted Labs (From admission, onward)   None      Vitals/Pain Today's Vitals   02/22/19 2124 02/22/19 2125 02/22/19 2130 02/22/19 2200  BP: 130/68  (!) 143/80 116/73  Pulse: (!) 121  (!) 122 (!) 120  Resp: (!) 25  (!) 23 (!) 21  Temp:   97.9 F (36.6 C) 98.4 F (36.9 C)  TempSrc:      SpO2: 95%  97% 96%  Weight:      Height:      PainSc:  0-No pain      Isolation Precautions No active isolations  Medications Medications  albuterol (PROVENTIL,VENTOLIN) solution continuous neb (0 mg/hr Nebulization Stopped 02/22/19 2125)  albuterol (PROVENTIL) (2.5 MG/3ML) 0.083% nebulizer solution 5 mg (5 mg Nebulization Given 02/22/19 1429)  magnesium sulfate IVPB 2 g 50 mL (0 g Intravenous Stopped 02/22/19 1817)  ipratropium (ATROVENT) nebulizer solution 0.5 mg (0.5 mg Nebulization Given 02/22/19 1737)  methylPREDNISolone sodium succinate (SOLU-MEDROL) 125 mg/2 mL injection 125 mg (125 mg Intravenous Given 02/22/19 1737)  albuterol (PROVENTIL) (2.5 MG/3ML) 0.083% nebulizer solution 5 mg (5 mg Nebulization Given 02/22/19 1737)    Mobility walks Low fall risk   Focused Assessments Pulmonary Assessment Handoff:  Lung sounds: Bilateral Breath Sounds: Diminished, Expiratory wheezes L Breath Sounds: Clear, Diminished, Expiratory wheezes R Breath Sounds: Clear, Diminished, Expiratory wheezes O2 Device: Room Air O2 Flow Rate (L/min): 8 L/min      R Recommendations: See Admitting Provider Note  Report given to:   Additional Notes: Asthma exacerbation

## 2019-02-22 NOTE — ED Triage Notes (Signed)
Pt states she has had SHOB since yesterday. Pt states she has also had a productive cough. Hx of asthma.

## 2019-02-22 NOTE — ED Notes (Signed)
RT called at this time to set up continuous neb tx

## 2019-02-23 DIAGNOSIS — J45901 Unspecified asthma with (acute) exacerbation: Secondary | ICD-10-CM

## 2019-02-23 DIAGNOSIS — D72829 Elevated white blood cell count, unspecified: Secondary | ICD-10-CM

## 2019-02-23 LAB — CBC
HEMATOCRIT: 41.6 % (ref 36.0–46.0)
Hemoglobin: 12.4 g/dL (ref 12.0–15.0)
MCH: 25.7 pg — ABNORMAL LOW (ref 26.0–34.0)
MCHC: 29.8 g/dL — ABNORMAL LOW (ref 30.0–36.0)
MCV: 86.3 fL (ref 80.0–100.0)
Platelets: 320 10*3/uL (ref 150–400)
RBC: 4.82 MIL/uL (ref 3.87–5.11)
RDW: 17.6 % — ABNORMAL HIGH (ref 11.5–15.5)
WBC: 7.9 10*3/uL (ref 4.0–10.5)
nRBC: 0 % (ref 0.0–0.2)

## 2019-02-23 MED ORDER — AZITHROMYCIN 250 MG PO TABS
500.0000 mg | ORAL_TABLET | Freq: Every day | ORAL | Status: AC
  Administered 2019-02-23 – 2019-02-25 (×3): 500 mg via ORAL
  Filled 2019-02-23 (×3): qty 2

## 2019-02-23 NOTE — H&P (Signed)
History and Physical    TRISTON BLAKEY GUY:[id number] DOB: 30-Aug-1981 DOA: 02/22/2019  PCP: Patient, No Pcp Per Patient coming from: Home  Chief Complaint: Shortness of breath  HPI: MALISE YANIK is a 38 y.o. female with medical history significant of asthma presenting the hospital for evaluation of shortness of breath.  Patient reports 2 to 3-day history of shortness of breath, cough, and wheezing.  She is currently not on any home medications for her asthma.  States she was admitted in December for an asthma exacerbation and sent home with a nebulizer machine but has not required it as she was feeling well until 2-3 days ago.  Denies having any fevers, chills, rhinorrhea, or sore throat.  No other complaints.  Review of Systems: As per HPI otherwise 10 point review of systems negative.  Past Medical History:  Diagnosis Date  . Asthma   . Medical history non-contributory     Past Surgical History:  Procedure Laterality Date  . NO PAST SURGERIES       reports that she has been smoking. She has been smoking about 0.50 packs per day. She uses smokeless tobacco. She reports current alcohol use. She reports that she does not use drugs.  No Known Allergies  No family history on file.  Prior to Admission medications   Medication Sig Start Date End Date Taking? Authorizing Provider  albuterol (PROVENTIL HFA;VENTOLIN HFA) 108 (90 Base) MCG/ACT inhaler Inhale 2 puffs into the lungs every 6 (six) hours as needed for wheezing or shortness of breath. 11/18/18  Yes Sheikh, Omair Latif, DO  benzonatate (TESSALON) 100 MG capsule Take 1 capsule (100 mg total) by mouth 3 (three) times daily as needed for cough. Patient not taking: Reported on 02/22/2019 11/18/18   Marguerita Merles Latif, DO  folic acid (FOLVITE) 1 MG tablet Take 1 tablet (1 mg total) by mouth daily. Patient not taking: Reported on 02/22/2019 11/18/18   Marguerita Merles Latif, DO  ipratropium-albuterol (DUONEB) 0.5-2.5 (3) MG/3ML SOLN Take  3 mLs by nebulization every 6 (six) hours as needed. Patient not taking: Reported on 02/22/2019 11/18/18   Marguerita Merles Latif, DO  Multiple Vitamin (MULTIVITAMIN WITH MINERALS) TABS tablet Take 1 tablet by mouth daily. Patient not taking: Reported on 02/22/2019 11/18/18   Marguerita Merles Latif, DO  oxyCODONE-acetaminophen (PERCOCET/ROXICET) 5-325 MG tablet Take 1 tablet by mouth every 8 (eight) hours as needed for severe pain. Patient not taking: Reported on 11/15/2018 11/18/16   Audry Pili, PA-C  predniSONE (STERAPRED UNI-PAK 21 TAB) 10 MG (21) TBPK tablet Take 6 pills Day 1, 5 Pills Day 2, 4 Pills Day 3, 3 Pills Day 4, 2 Pills Day 5, 1 Pill Day 6 and Stop Day 7 Patient not taking: Reported on 02/22/2019 11/18/18   Marguerita Merles Latif, DO  thiamine 100 MG tablet Take 1 tablet (100 mg total) by mouth daily. Patient not taking: Reported on 02/22/2019 11/18/18   Marguerita Merles Latif, DO  traMADol (ULTRAM) 50 MG tablet Take 1 tablet (50 mg total) by mouth every 6 (six) hours as needed. Patient not taking: Reported on 11/15/2018 04/23/18   Janne Napoleon, NP    Physical Exam: Vitals:   02/22/19 2130 02/22/19 2200 02/22/19 2230 02/23/19 0000  BP: (!) 143/80 116/73 138/83   Pulse: (!) 122 (!) 120 (!) 120 (!) 113  Resp: (!) 23 (!) 21 20   Temp: 97.9 F (36.6 C) 98.4 F (36.9 C) 98.2 F (36.8 C)   TempSrc:  Oral   SpO2: 97% 96% 97%   Weight:      Height:        Physical Exam  Constitutional: She is oriented to person, place, and time. She appears well-developed and well-nourished. No distress.  HENT:  Head: Normocephalic.  Mouth/Throat: Oropharynx is clear and moist.  Eyes: Right eye exhibits no discharge. Left eye exhibits no discharge.  Neck: Neck supple.  Cardiovascular: Regular rhythm and intact distal pulses.  Tachycardic  Pulmonary/Chest: Effort normal. She has wheezes. She has no rales.  Speaking clearly in full sentences  Abdominal: Soft. Bowel sounds are normal. She exhibits no  distension. There is no abdominal tenderness. There is no guarding.  Musculoskeletal:        General: No edema.  Neurological: She is alert and oriented to person, place, and time.  Skin: Skin is warm and dry. She is not diaphoretic.     Labs on Admission: I have personally reviewed following labs and imaging studies  CBC: Recent Labs  Lab 02/22/19 1750  WBC 11.6*  NEUTROABS 7.6  HGB 13.2  HCT 44.3  MCV 87.9  PLT 325   Basic Metabolic Panel: Recent Labs  Lab 02/22/19 1750  NA 138  K 4.6  CL 105  CO2 25  GLUCOSE 101*  BUN 8  CREATININE 1.00  CALCIUM 9.0   GFR: Estimated Creatinine Clearance: 108.2 mL/min (by C-G formula based on SCr of 1 mg/dL). Liver Function Tests: No results for input(s): AST, ALT, ALKPHOS, BILITOT, PROT, ALBUMIN in the last 168 hours. No results for input(s): LIPASE, AMYLASE in the last 168 hours. No results for input(s): AMMONIA in the last 168 hours. Coagulation Profile: No results for input(s): INR, PROTIME in the last 168 hours. Cardiac Enzymes: No results for input(s): CKTOTAL, CKMB, CKMBINDEX, TROPONINI in the last 168 hours. BNP (last 3 results) No results for input(s): PROBNP in the last 8760 hours. HbA1C: No results for input(s): HGBA1C in the last 72 hours. CBG: No results for input(s): GLUCAP in the last 168 hours. Lipid Profile: No results for input(s): CHOL, HDL, LDLCALC, TRIG, CHOLHDL, LDLDIRECT in the last 72 hours. Thyroid Function Tests: No results for input(s): TSH, T4TOTAL, FREET4, T3FREE, THYROIDAB in the last 72 hours. Anemia Panel: No results for input(s): VITAMINB12, FOLATE, FERRITIN, TIBC, IRON, RETICCTPCT in the last 72 hours. Urine analysis:    Component Value Date/Time   COLORURINE YELLOW 10/07/2016 1455   APPEARANCEUR HAZY (A) 10/07/2016 1455   LABSPEC 1.020 10/07/2016 1455   PHURINE 6.0 10/07/2016 1455   GLUCOSEU NEGATIVE 10/07/2016 1455   HGBUR LARGE (A) 10/07/2016 1455   BILIRUBINUR NEGATIVE 10/07/2016  1455   KETONESUR NEGATIVE 10/07/2016 1455   PROTEINUR NEGATIVE 10/07/2016 1455   NITRITE NEGATIVE 10/07/2016 1455   LEUKOCYTESUR NEGATIVE 10/07/2016 1455    Radiological Exams on Admission: Dg Chest 2 View  Result Date: 02/22/2019 CLINICAL DATA:  Shortness of breath, productive cough. EXAM: CHEST - 2 VIEW COMPARISON:  Radiograph of November 17, 2018. FINDINGS: The heart size and mediastinal contours are within normal limits. Both lungs are clear. No pneumothorax or pleural effusion is noted. The visualized skeletal structures are unremarkable. IMPRESSION: No active cardiopulmonary disease. Electronically Signed   By: Lupita Raider, M.D.   On: 02/22/2019 15:04    EKG: Independently reviewed.  Normal sinus rhythm.  Assessment/Plan Principal Problem:   Acute asthma exacerbation Active Problems:   Leukocytosis   Acute asthma exacerbation -Patient is presenting with 2-3-day history of dyspnea, wheezing, and  cough. -Continues to have wheezing despite receiving multiple nebulizer treatments, Solu-Medrol 125 mg, and magnesium 2 g in the ED.  Not hypoxic. -Solu-Medrol 60 mg every 8 hours -Levalbuterol-ipratropium every 8 hours -Albuterol nebulizer every 2 hours as needed -Tessalon Perles PRN cough  Mild leukocytosis -Likely reactive.  Patient is afebrile.  Chest x-ray not suggestive of pneumonia. -Continue to monitor CBC  DVT prophylaxis: Lovenox Code Status: Full code Family Communication: No family available. Disposition Plan: Anticipate discharge after clinical improvement. Consults called: None Admission status: Observation, telemetry   John Giovanni MD [facility] Pager [phone]  If 7PM-7AM, please contact night-coverage [email or web address] Password [facility]  02/23/2019, 12:35 AM

## 2019-02-23 NOTE — Progress Notes (Signed)
Rebekah Evans is a 38 y.o. female with medical history significant of asthma presenting the hospital for evaluation of shortness of breath.  Patient reports 2 to 3-day history of shortness of breath, cough, and wheezing.   Admitted for asthma exacerbation.  On IV solumedrol and duonebs.  Continue the same for another 24 hours and watch for improvement.  On exam this am. She is diffusely wheezing and sob.    [name], MD [phone]

## 2019-02-23 NOTE — Plan of Care (Signed)

## 2019-02-24 NOTE — Progress Notes (Signed)
PROGRESS NOTE    Rebekah Evans  WGN:[id number] DOB: 07/17/1981 DOA: 02/22/2019 PCP: Patient, No Pcp Per   Brief Narrative:   Rebekah D Rhodesis a 38 y.o.femalewith medical history significant ofasthma presenting the hospital for evaluation of shortness of breath. Patient reports 2 to 3-day history of shortness of breath, cough, and wheezing. Admitted for asthma exacerbation.  On IV solumedrol and duonebs.  Continue the same for another 24 hours and watch for improvement.    Assessment & Plan:   Principal Problem:   Acute asthma exacerbation Active Problems:   Leukocytosis   Acute asthma exacerbation: Improving. But pt still very dyspneic on walking and talking.  Tachypnea and diffusely wheezing.  Continue with IV solumedrol and duonebs.  Continue with zithromax.    Leukocytosis resolved.     DVT prophylaxis:lovenox.  Code Status: full code.  Family Communication: none at bedside.  Disposition Plan: possible d.c tomorrow.    Consultants:   None.   Procedures: none.   Antimicrobials: azithromycin.   Subjective: Dyspneic.  Objective: Vitals:   02/23/19 2149 02/24/19 0445 02/24/19 1311 02/24/19 1405  BP:  125/76 135/82   Pulse:  83 86   Resp:  19 18   Temp:  98 F (36.7 C) 98 F (36.7 C)   TempSrc:  Oral Oral   SpO2: 94% 93% 90% 97%  Weight:      Height:        Intake/Output Summary (Last 24 hours) at 02/24/2019 1726 Last data filed at 02/24/2019 0900 Gross per 24 hour  Intake 360 ml  Output -  Net 360 ml   Filed Weights   02/22/19 1425  Weight: 122.5 kg    Examination:  General exam: moderate distress.  Respiratory system: bilateral wheezing heard.  Cardiovascular system: S1 & S2 heard, RRR. No JVD,  Gastrointestinal system: Abdomen is nondistended, soft and nontender. No organomegaly or masses felt. Normal bowel sounds heard. Central nervous system: Alert and oriented. No focal neurological deficits. Extremities: Symmetric 5 x 5  power. Skin: No rashes, lesions or ulcers Psychiatry: anxious.     Data Reviewed: I have personally reviewed following labs and imaging studies  CBC: Recent Labs  Lab 02/22/19 1750 02/23/19 0348  WBC 11.6* 7.9  NEUTROABS 7.6  --   HGB 13.2 12.4  HCT 44.3 41.6  MCV 87.9 86.3  PLT 325 320   Basic Metabolic Panel: Recent Labs  Lab 02/22/19 1750  NA 138  K 4.6  CL 105  CO2 25  GLUCOSE 101*  BUN 8  CREATININE 1.00  CALCIUM 9.0   GFR: Estimated Creatinine Clearance: 108.2 mL/min (by C-G formula based on SCr of 1 mg/dL). Liver Function Tests: No results for input(s): AST, ALT, ALKPHOS, BILITOT, PROT, ALBUMIN in the last 168 hours. No results for input(s): LIPASE, AMYLASE in the last 168 hours. No results for input(s): AMMONIA in the last 168 hours. Coagulation Profile: No results for input(s): INR, PROTIME in the last 168 hours. Cardiac Enzymes: No results for input(s): CKTOTAL, CKMB, CKMBINDEX, TROPONINI in the last 168 hours. BNP (last 3 results) No results for input(s): PROBNP in the last 8760 hours. HbA1C: No results for input(s): HGBA1C in the last 72 hours. CBG: No results for input(s): GLUCAP in the last 168 hours. Lipid Profile: No results for input(s): CHOL, HDL, LDLCALC, TRIG, CHOLHDL, LDLDIRECT in the last 72 hours. Thyroid Function Tests: No results for input(s): TSH, T4TOTAL, FREET4, T3FREE, THYROIDAB in the last 72 hours. Anemia Panel: No results  for input(s): VITAMINB12, FOLATE, FERRITIN, TIBC, IRON, RETICCTPCT in the last 72 hours. Sepsis Labs: No results for input(s): PROCALCITON, LATICACIDVEN in the last 168 hours.  No results found for this or any previous visit (from the past 240 hour(s)).       Radiology Studies: No results found.      Scheduled Meds: . azithromycin  500 mg Oral Daily  . enoxaparin (LOVENOX) injection  40 mg Subcutaneous QHS  . ipratropium  0.5 mg Nebulization TID  . levalbuterol  0.63 mg Nebulization TID  .  methylPREDNISolone (SOLU-MEDROL) injection  60 mg Intravenous Q8H   Continuous Infusions:   LOS: 0 days    Time spent: 28 minutes.     Rebekah Mody, MD [facility] Pager [phone]  If 7PM-7AM, please contact night-coverage [email or web address] Password [facility] 02/24/2019, 5:26 PM

## 2019-02-25 LAB — BASIC METABOLIC PANEL
Anion gap: 9 (ref 5–15)
BUN: 15 mg/dL (ref 6–20)
CO2: 25 mmol/L (ref 22–32)
Calcium: 8.9 mg/dL (ref 8.9–10.3)
Chloride: 101 mmol/L (ref 98–111)
Creatinine, Ser: 0.85 mg/dL (ref 0.44–1.00)
GFR calc non Af Amer: >60 mL/min (ref >60)
Glucose, Bld: 142 mg/dL — ABNORMAL HIGH (ref 70–99)
Potassium: 4.3 mmol/L (ref 3.5–5.1)
Sodium: 135 mmol/L (ref 135–145)

## 2019-02-25 MED ORDER — PREDNISONE 20 MG PO TABS: ORAL_TABLET | ORAL | 0 refills | Status: DC

## 2019-02-25 MED ORDER — BENZONATATE 100 MG PO CAPS: 200.0000 mg | ORAL_CAPSULE | Freq: Three times a day (TID) | ORAL | 0 refills | Status: DC | PRN

## 2019-02-25 MED ORDER — IPRATROPIUM-ALBUTEROL 0.5-2.5 (3) MG/3ML IN SOLN: 3.0000 mL | Freq: Four times a day (QID) | RESPIRATORY_TRACT | 10 refills | Status: DC | PRN

## 2019-02-25 MED ORDER — ADULT MULTIVITAMIN W/MINERALS CH: 1.0000 | ORAL_TABLET | Freq: Every day | ORAL | 0 refills | Status: DC

## 2019-03-02 NOTE — Discharge Summary (Signed)
Physician Discharge Summary  Rebekah Evans AYT:[id number] DOB: 02/21/1981 DOA: 02/22/2019  PCP: Patient, No Pcp Per  Admit date: 02/22/2019 Discharge date: 3/15 /2020  Admitted From: Home.  Disposition:  Home.   Recommendations for Outpatient Follow-up:  1. Follow up with PCP in 1-2 weeks 2. Please obtain BMP/CBC in one week   Discharge Condition: stable.  CODE STATUS: full code.  Diet recommendation: Heart Healthy    Brief/Interim Summary: Rebekah D Rhodesis a 38 y.o.femalewith medical history significant ofasthma presenting the hospital for evaluation of shortness of breath. Patient reports 2 to 3-day history of shortness of breath, cough, and wheezing. Admitted for asthma exacerbation.  She was started On IV solumedrol and duonebs. Breathing improved, transitioned to prednisone taper on discharge.    Discharge Diagnoses:  Principal Problem:   Acute asthma exacerbation Active Problems:   Leukocytosis  Acute asthma exacerbation: Improved with two daysof IV solumedrol, transitioned to prednisone taper on discharge and recommended to continue with duonebs for another one week .  Recommended to follow up with PCP in one week.   Leukocytosis resolved.    Discharge Instructions  Discharge Instructions    Diet - low sodium heart healthy   Complete by:  As directed    Discharge instructions   Complete by:  As directed    Follow up with PCP in one week.     Allergies as of 02/25/2019   No Known Allergies     Medication List    STOP taking these medications   oxyCODONE-acetaminophen 5-325 MG tablet Commonly known as:  PERCOCET/ROXICET   predniSONE 10 MG (21) Tbpk tablet Commonly known as:  STERAPRED UNI-PAK 21 TAB Replaced by:  predniSONE 20 MG tablet   thiamine 100 MG tablet   traMADol 50 MG tablet Commonly known as:  ULTRAM     TAKE these medications   albuterol 108 (90 Base) MCG/ACT inhaler Commonly known as:  PROVENTIL HFA;VENTOLIN HFA Inhale 2  puffs into the lungs every 6 (six) hours as needed for wheezing or shortness of breath.   benzonatate 100 MG capsule Commonly known as:  TESSALON Take 2 capsules (200 mg total) by mouth 3 (three) times daily as needed for cough. What changed:  how much to take   folic acid 1 MG tablet Commonly known as:  FOLVITE Take 1 tablet (1 mg total) by mouth daily.   ipratropium-albuterol 0.5-2.5 (3) MG/3ML Soln Commonly known as:  DUONEB Take 3 mLs by nebulization every 6 (six) hours as needed.   multivitamin with minerals Tabs tablet Take 1 tablet by mouth daily.   predniSONE 20 MG tablet Commonly known as:  Deltasone Prednisone 60 mg daily for 3 days followed by  Prednisone 40 mg daily for 3 days followed by  Prednisone 20 mg daily for 3 days. Replaces:  predniSONE 10 MG (21) Tbpk tablet       No Known Allergies  Consultations:  None    Procedures/Studies: Dg Chest 2 View  Result Date: 02/22/2019 CLINICAL DATA:  Shortness of breath, productive cough. EXAM: CHEST - 2 VIEW COMPARISON:  Radiograph of November 17, 2018. FINDINGS: The heart size and mediastinal contours are within normal limits. Both lungs are clear. No pneumothorax or pleural effusion is noted. The visualized skeletal structures are unremarkable. IMPRESSION: No active cardiopulmonary disease. Electronically Signed   By: Lupita Raider, M.D.   On: 02/22/2019 15:04     Subjective: Wants to go home, feeling better.   Discharge Exam: Vitals:  02/24/19 2029 02/25/19 0528  BP: 134/80 (!) 141/86  Pulse: 85 81  Resp: 18 20  Temp: 98 F (36.7 C) 98.1 F (36.7 C)  SpO2: 92% 91%   Vitals:   02/24/19 1405 02/24/19 1955 02/24/19 2029 02/25/19 0528  BP:   134/80 (!) 141/86  Pulse:   85 81  Resp:   18 20  Temp:   98 F (36.7 C) 98.1 F (36.7 C)  TempSrc:   Oral Oral  SpO2: 97% 95% 92% 91%  Weight:      Height:        General: Pt is alert, awake, not in acute distress Cardiovascular: RRR, S1/S2 +, no rubs,  no gallops Respiratory: scattered wheezing, air entry better.  Abdominal: Soft, NT, ND, bowel sounds + Extremities: no edema, no cyanosis    The results of significant diagnostics from this hospitalization (including imaging, microbiology, ancillary and laboratory) are listed below for reference.     Microbiology: No results found for this or any previous visit (from the past 240 hour(s)).   Labs: BNP (last 3 results) No results for input(s): BNP in the last 8760 hours. Basic Metabolic Panel: Recent Labs  Lab 02/25/19 0424  NA 135  K 4.3  CL 101  CO2 25  GLUCOSE 142*  BUN 15  CREATININE 0.85  CALCIUM 8.9   Liver Function Tests: No results for input(s): AST, ALT, ALKPHOS, BILITOT, PROT, ALBUMIN in the last 168 hours. No results for input(s): LIPASE, AMYLASE in the last 168 hours. No results for input(s): AMMONIA in the last 168 hours. CBC: No results for input(s): WBC, NEUTROABS, HGB, HCT, MCV, PLT in the last 168 hours. Cardiac Enzymes: No results for input(s): CKTOTAL, CKMB, CKMBINDEX, TROPONINI in the last 168 hours. BNP: Invalid input(s): POCBNP CBG: No results for input(s): GLUCAP in the last 168 hours. D-Dimer No results for input(s): DDIMER in the last 72 hours. Hgb A1c No results for input(s): HGBA1C in the last 72 hours. Lipid Profile No results for input(s): CHOL, HDL, LDLCALC, TRIG, CHOLHDL, LDLDIRECT in the last 72 hours. Thyroid function studies No results for input(s): TSH, T4TOTAL, T3FREE, THYROIDAB in the last 72 hours.  Invalid input(s): FREET3 Anemia work up No results for input(s): VITAMINB12, FOLATE, FERRITIN, TIBC, IRON, RETICCTPCT in the last 72 hours. Urinalysis    Component Value Date/Time   COLORURINE YELLOW 10/07/2016 1455   APPEARANCEUR HAZY (A) 10/07/2016 1455   LABSPEC 1.020 10/07/2016 1455   PHURINE 6.0 10/07/2016 1455   GLUCOSEU NEGATIVE 10/07/2016 1455   HGBUR LARGE (A) 10/07/2016 1455   BILIRUBINUR NEGATIVE 10/07/2016 1455    KETONESUR NEGATIVE 10/07/2016 1455   PROTEINUR NEGATIVE 10/07/2016 1455   NITRITE NEGATIVE 10/07/2016 1455   LEUKOCYTESUR NEGATIVE 10/07/2016 1455   Sepsis Labs Invalid input(s): PROCALCITONIN,  WBC,  LACTICIDVEN Microbiology No results found for this or any previous visit (from the past 240 hour(s)).   Time coordinating discharge: 32  minutes  SIGNED:   [name], MD  [facility] 03/02/2019, 8:50 AM Pager   If 7PM-7AM, please contact night-coverage [email or web address] Password [email or web address]

## 2019-04-04 ENCOUNTER — Encounter: Payer: Self-pay | Admitting: *Deleted

## 2019-04-04 NOTE — Congregational Nurse Program (Signed)
Patient reports no medical problems and no medication needs at this time. Patient is uninsured- without PCP and does need assistance with that

## 2019-04-16 NOTE — Congregational Nurse Program (Signed)
Closing encounter per request. 

## 2019-04-18 NOTE — Progress Notes (Signed)
COVID Hotel Screening performed. Temperature, PHQ-9, and need for medical care and medications assessed. No additional needs assessed at this time.  Dale Ribeiro RN MSN 

## 2019-04-25 NOTE — Progress Notes (Signed)
COVID Hotel Screening performed. Temperature, PHQ-9, and need for medical care and medications assessed. No additional needs assessed at this time.  Omer Monter RN MSN 

## 2019-05-03 NOTE — Progress Notes (Signed)
COVID Hotel Screening performed. Temperature, PHQ-9, and need for medical care and medications assessed. No additional needs assessed at this time.  Izear Pine RN MSN 

## 2019-05-10 NOTE — Progress Notes (Signed)
COVID Hotel Screening performed. Temperature, PHQ-9, and need for medical care and medications assessed. No additional needs assessed at this time.  Charda Janis RN MSN 

## 2019-05-16 ENCOUNTER — Other Ambulatory Visit: Payer: Self-pay | Admitting: Hematology

## 2019-05-16 DIAGNOSIS — Z20822 Contact with and (suspected) exposure to covid-19: Secondary | ICD-10-CM

## 2019-05-16 NOTE — Progress Notes (Signed)
COVID orders placed for mobile unit 

## 2019-05-17 ENCOUNTER — Other Ambulatory Visit: Payer: Self-pay | Admitting: *Deleted

## 2019-05-17 DIAGNOSIS — Z20822 Contact with and (suspected) exposure to covid-19: Secondary | ICD-10-CM

## 2019-05-18 NOTE — Addendum Note (Signed)
Addended by: Rishikesh Khachatryan M on: 05/18/2019 11:45 AM   Modules accepted: Orders  

## 2019-05-23 NOTE — Progress Notes (Signed)
COVID Hotel Screening performed. COVID screening, temperature, PHQ-9, and need for medical care and medications assessed. No additional needs assessed at this time.  Kailly Richoux RN MSN 

## 2019-05-31 NOTE — Progress Notes (Signed)
COVID Hotel Screening performed. COVID screening, temperature, PHQ-9, and need for medical care and medications assessed. No additional needs assessed at this time.  Rashunda Passon RN MSN 

## 2019-06-11 NOTE — Progress Notes (Signed)
COVID Hotel Screening performed. Temperature, PHQ-9, and need for medical care and medications assessed. No additional needs assessed at this time.  Rebekah Morejon  MSN, RN 

## 2019-06-21 ENCOUNTER — Other Ambulatory Visit: Payer: Self-pay | Admitting: *Deleted

## 2019-06-21 DIAGNOSIS — Z20822 Contact with and (suspected) exposure to covid-19: Secondary | ICD-10-CM

## 2019-06-24 NOTE — Progress Notes (Signed)
COVID Hotel Screening performed. Temperature, PHQ-9, and medication assessment.   Daxton Nydam MSN, RN 

## 2019-06-28 LAB — NOVEL CORONAVIRUS, NAA: SARS-CoV-2, NAA: NOT DETECTED

## 2019-07-27 ENCOUNTER — Other Ambulatory Visit: Payer: Self-pay

## 2019-07-27 DIAGNOSIS — Z20822 Contact with and (suspected) exposure to covid-19: Secondary | ICD-10-CM

## 2019-07-29 LAB — NOVEL CORONAVIRUS, NAA: SARS-CoV-2, NAA: NOT DETECTED

## 2019-08-01 NOTE — Progress Notes (Signed)
COVID-19 Screening performed. Temperature, PHQ-9, and need for medical care and medications assessed. No additional needs assessed at this time.  Deveion Denz MSN, RN 

## 2019-08-12 NOTE — Progress Notes (Signed)
COVID-19 Screening performed. Temperature, PHQ-9, and need for medical care and medications assessed. No additional needs assessed at this time.  Christena Sunderlin MSN, RN 

## 2019-09-05 NOTE — Progress Notes (Signed)
COVID-19 Screening performed. Temperature, PHQ-9, and need for medical care and medications assessed. No additional needs assessed at this time.  Giavonna Pflum MSN, RN 

## 2020-03-15 ENCOUNTER — Encounter (HOSPITAL_COMMUNITY): Payer: Self-pay | Admitting: Emergency Medicine

## 2020-03-15 ENCOUNTER — Other Ambulatory Visit: Payer: Self-pay

## 2020-03-15 ENCOUNTER — Emergency Department (HOSPITAL_COMMUNITY): Payer: Self-pay

## 2020-03-15 ENCOUNTER — Observation Stay (HOSPITAL_COMMUNITY)
Admission: EM | Admit: 2020-03-15 | Discharge: 2020-03-16 | Disposition: A | Discharge status: Home / Self Care | Payer: Self-pay | Attending: Internal Medicine | Admitting: Internal Medicine

## 2020-03-15 DIAGNOSIS — J45901 Unspecified asthma with (acute) exacerbation: Secondary | ICD-10-CM | POA: Diagnosis present

## 2020-03-15 DIAGNOSIS — F1210 Cannabis abuse, uncomplicated: Secondary | ICD-10-CM | POA: Insufficient documentation

## 2020-03-15 DIAGNOSIS — Z87891 Personal history of nicotine dependence: Secondary | ICD-10-CM | POA: Insufficient documentation

## 2020-03-15 DIAGNOSIS — Z20822 Contact with and (suspected) exposure to covid-19: Secondary | ICD-10-CM | POA: Insufficient documentation

## 2020-03-15 DIAGNOSIS — N6320 Unspecified lump in the left breast, unspecified quadrant: Secondary | ICD-10-CM | POA: Insufficient documentation

## 2020-03-15 DIAGNOSIS — J4541 Moderate persistent asthma with (acute) exacerbation: Secondary | ICD-10-CM

## 2020-03-15 DIAGNOSIS — R739 Hyperglycemia, unspecified: Secondary | ICD-10-CM

## 2020-03-15 LAB — BASIC METABOLIC PANEL
Anion gap: 8 (ref 5–15)
BUN: 12 mg/dL (ref 6–20)
CO2: 24 mmol/L (ref 22–32)
Calcium: 8.9 mg/dL (ref 8.9–10.3)
Chloride: 107 mmol/L (ref 98–111)
Creatinine, Ser: 0.98 mg/dL (ref 0.44–1.00)
GFR calc Af Amer: >60 mL/min (ref >60)
GFR calc non Af Amer: >60 mL/min (ref >60)
Glucose, Bld: 138 mg/dL — ABNORMAL HIGH (ref 70–99)
Potassium: 3.9 mmol/L (ref 3.5–5.1)
Sodium: 139 mmol/L (ref 135–145)

## 2020-03-15 LAB — CBC
HCT: 44.4 % (ref 36.0–46.0)
Hemoglobin: 13.7 g/dL (ref 12.0–15.0)
MCH: 26.6 pg (ref 26.0–34.0)
MCHC: 30.9 g/dL (ref 30.0–36.0)
MCV: 86 fL (ref 80.0–100.0)
Platelets: 289 10*3/uL (ref 150–400)
RBC: 5.16 MIL/uL — ABNORMAL HIGH (ref 3.87–5.11)
RDW: 16 % — ABNORMAL HIGH (ref 11.5–15.5)
WBC: 6.3 10*3/uL (ref 4.0–10.5)
nRBC: 0 % (ref 0.0–0.2)

## 2020-03-15 LAB — PROCALCITONIN: Procalcitonin: <0.1 ng/mL

## 2020-03-15 LAB — SARS CORONAVIRUS 2 (TAT 6-24 HRS): SARS Coronavirus 2: NEGATIVE

## 2020-03-15 LAB — HIV ANTIBODY (ROUTINE TESTING W REFLEX): HIV Screen 4th Generation wRfx: NONREACTIVE

## 2020-03-15 LAB — POC SARS CORONAVIRUS 2 AG -  ED: SARS Coronavirus 2 Ag: NEGATIVE

## 2020-03-15 LAB — BRAIN NATRIURETIC PEPTIDE: B Natriuretic Peptide: 42.6 pg/mL (ref 0.0–100.0)

## 2020-03-15 MED ORDER — IPRATROPIUM-ALBUTEROL 0.5-2.5 (3) MG/3ML IN SOLN: 3.0000 mL | RESPIRATORY_TRACT | Status: DC | PRN

## 2020-03-15 MED ORDER — AZITHROMYCIN 250 MG PO TABS
500.0000 mg | ORAL_TABLET | Freq: Every day | ORAL | Status: AC
  Administered 2020-03-15: 500 mg via ORAL
  Filled 2020-03-15: qty 2

## 2020-03-15 MED ORDER — AZITHROMYCIN 250 MG PO TABS
250.0000 mg | ORAL_TABLET | Freq: Every day | ORAL | Status: DC
  Administered 2020-03-16: 250 mg via ORAL
  Filled 2020-03-15 (×2): qty 1

## 2020-03-15 MED ORDER — BUDESONIDE 0.5 MG/2ML IN SUSP
0.5000 mg | Freq: Two times a day (BID) | RESPIRATORY_TRACT | Status: DC
  Administered 2020-03-15 – 2020-03-16 (×3): 0.5 mg via RESPIRATORY_TRACT
  Filled 2020-03-15 (×3): qty 2

## 2020-03-15 MED ORDER — ALBUTEROL SULFATE (2.5 MG/3ML) 0.083% IN NEBU
5.0000 mg | INHALATION_SOLUTION | Freq: Once | RESPIRATORY_TRACT | Status: AC
  Administered 2020-03-15: 5 mg via RESPIRATORY_TRACT
  Filled 2020-03-15: qty 6

## 2020-03-15 MED ORDER — PANTOPRAZOLE SODIUM 40 MG PO TBEC
40.0000 mg | DELAYED_RELEASE_TABLET | Freq: Every day | ORAL | Status: DC
  Administered 2020-03-15 – 2020-03-16 (×2): 40 mg via ORAL
  Filled 2020-03-15 (×2): qty 1

## 2020-03-15 MED ORDER — SODIUM CHLORIDE 0.9 % IV SOLN: Freq: Once | INTRAVENOUS | Status: AC

## 2020-03-15 MED ORDER — IPRATROPIUM-ALBUTEROL 0.5-2.5 (3) MG/3ML IN SOLN
3.0000 mL | Freq: Three times a day (TID) | RESPIRATORY_TRACT | Status: DC
  Administered 2020-03-16: 3 mL via RESPIRATORY_TRACT
  Filled 2020-03-15: qty 3

## 2020-03-15 MED ORDER — ACETAMINOPHEN 650 MG RE SUPP: 650.0000 mg | Freq: Four times a day (QID) | RECTAL | Status: DC | PRN

## 2020-03-15 MED ORDER — IPRATROPIUM-ALBUTEROL 0.5-2.5 (3) MG/3ML IN SOLN
3.0000 mL | Freq: Four times a day (QID) | RESPIRATORY_TRACT | Status: DC
  Administered 2020-03-15 (×3): 3 mL via RESPIRATORY_TRACT
  Filled 2020-03-15 (×3): qty 3

## 2020-03-15 MED ORDER — DM-GUAIFENESIN ER 30-600 MG PO TB12
1.0000 | ORAL_TABLET | Freq: Two times a day (BID) | ORAL | Status: DC
  Administered 2020-03-15 – 2020-03-16 (×3): 1 via ORAL
  Filled 2020-03-15 (×4): qty 1

## 2020-03-15 MED ORDER — ACETAMINOPHEN 325 MG PO TABS
650.0000 mg | ORAL_TABLET | Freq: Four times a day (QID) | ORAL | Status: DC | PRN
  Administered 2020-03-15 – 2020-03-16 (×2): 650 mg via ORAL
  Filled 2020-03-15 (×2): qty 2

## 2020-03-15 MED ORDER — IPRATROPIUM BROMIDE 0.02 % IN SOLN
0.5000 mg | Freq: Once | RESPIRATORY_TRACT | Status: AC
  Administered 2020-03-15: 0.5 mg via RESPIRATORY_TRACT
  Filled 2020-03-15: qty 2.5

## 2020-03-15 MED ORDER — HYDROCOD POLST-CPM POLST ER 10-8 MG/5ML PO SUER: 5.0000 mL | Freq: Two times a day (BID) | ORAL | Status: DC | PRN

## 2020-03-15 MED ORDER — ALBUTEROL (5 MG/ML) CONTINUOUS INHALATION SOLN
10.0000 mg | INHALATION_SOLUTION | RESPIRATORY_TRACT | Status: AC
  Administered 2020-03-15: 10 mg via RESPIRATORY_TRACT
  Filled 2020-03-15: qty 20

## 2020-03-15 MED ORDER — METHYLPREDNISOLONE SODIUM SUCC 40 MG IJ SOLR
40.0000 mg | Freq: Two times a day (BID) | INTRAMUSCULAR | Status: DC
  Administered 2020-03-15 – 2020-03-16 (×3): 40 mg via INTRAVENOUS
  Filled 2020-03-15 (×3): qty 1

## 2020-03-15 MED ORDER — ENOXAPARIN SODIUM 40 MG/0.4ML ~~LOC~~ SOLN
40.0000 mg | SUBCUTANEOUS | Status: DC
  Filled 2020-03-15 (×2): qty 0.4

## 2020-03-15 NOTE — ED Notes (Addendum)
Pt ambulated in hallway on room air with no assistance. O2 sat remained between 91-94% while ambulating. Pt reported some SOB but stated she feels better than before.

## 2020-03-15 NOTE — ED Notes (Signed)
Pt transported to Xray. 

## 2020-03-15 NOTE — H&P (Signed)
History and Physical  Rebekah Evans YIR:[id number] DOB: May 25, 1981 DOA: 03/15/2020  Referring physician: Darylene Price PCP: Patient, No Pcp Per  Patient coming from: Home  Chief Complaint: Shortness of breath  HPI: Rebekah Evans is a 39 y.o. female with medical history significant for asthma and THC use who presents to the emergency department due to 3-day onset of increasing shortness of breath and nonproductive cough.  Patient complained of chest tightness with wheezing symptoms were initially controlled with home albuterol however, patient noted that despite using home albuterol more frequently, she continued to have increased work of breathing which was worse tonight, she activated EMS and magnesium 2 g and Solu-Medrol 125 mg was given en route to the ED. She denies fever, chills, chest pain, nausea, vomiting, abdominal pain.  ED Course:  In the emergency department, BP was 140/95 and other vital signs were normal.  Work-up in the ED showed normal CBC and BMP except for hyperglycemia.  Respiratory panel for influenza, RSV and COVID-19 virus was negative.  Chest x-ray showed no active cardiopulmonary disease.  Several rounds of albuterol and Atrovent were given in the ED with slight improvement in wheezing, on walking patient in the ED, though she endorsed slight improvement in breathing effort, she still complained of shortness of breath decided to admit her for further management. Hospitalist was asked to admit patient for further evaluation and management.  Review of Systems: Constitutional: Negative for chills and fever.  HENT: Negative for ear pain and sore throat.   Eyes: Negative for pain and visual disturbance.  Respiratory: Positive for cough, chest tightness and shortness of breath.   Cardiovascular: Negative for chest pain and palpitations.  Gastrointestinal: Negative for abdominal pain and vomiting.  Endocrine: Negative for polyphagia and polyuria.  Genitourinary: Negative for  decreased urine volume, dysuria, enuresis, hematuria, vaginal discharge and vaginal pain.  Musculoskeletal: Negative for arthralgias and back pain.  Skin: Negative for color change and rash.  Allergic/Immunologic: Negative for immunocompromised state.  Neurological: Negative for tremors, syncope, speech difficulty, weakness, light-headedness and headaches.  Hematological: Does not bruise/bleed easily.  All other systems reviewed and are negative   Past Medical History:  Diagnosis Date  . Asthma    History reviewed. No pertinent surgical history.  Social History:  reports that she quit smoking about 1 months ago. Her smoking use included cigarettes. She has a 25.00 pack-year smoking history. She has never used smokeless tobacco. She reports current alcohol use. She reports current drug use. Drug: Marijuana.   No Known Allergies  No family history on file.    Prior to Admission medications   Not on File    Physical Exam: BP (!) 143/68   Pulse 98   Temp 98.3 F (36.8 C) (Oral)   Resp (!) 21   Ht 5\' 8"  (1.727 m)   Wt 131.5 kg   LMP 02/26/2020   SpO2 94%   BMI 44.09 kg/m   . General: 39 y.o. year-old female well developed well nourished in no acute distress.  Alert and oriented x3. 31 HEENT: NCAT, PERRLA . Neck: Supple, trachea medial . Cardiovascular: Regular rate and rhythm with no rubs or gallops.  No thyromegaly or JVD noted.  No lower extremity edema. 2/4 pulses in all 4 extremities. Marland Kitchen Respiratory: Diffuse wheezing bilaterally.  No accessory muscle use . Abdomen: Soft nontender nondistended with normal bowel sounds x4 quadrants. . Muskuloskeletal: No cyanosis, clubbing or edema noted bilaterally . Neuro: CN II-XII intact, strength, sensation, reflexes . Skin: No  ulcerative lesions noted or rashes . Psychiatry: Judgement and insight appear normal. Mood is appropriate for condition and setting          Labs on Admission:  Basic Metabolic Panel: Recent Labs  Lab  03/15/20 0214  NA 139  K 3.9  CL 107  CO2 24  GLUCOSE 138*  BUN 12  CREATININE 0.98  CALCIUM 8.9   Liver Function Tests: No results for input(s): AST, ALT, ALKPHOS, BILITOT, PROT, ALBUMIN in the last 168 hours. No results for input(s): LIPASE, AMYLASE in the last 168 hours. No results for input(s): AMMONIA in the last 168 hours. CBC: Recent Labs  Lab 03/15/20 0214  WBC 6.3  HGB 13.7  HCT 44.4  MCV 86.0  PLT 289   Cardiac Enzymes: No results for input(s): CKTOTAL, CKMB, CKMBINDEX, TROPONINI in the last 168 hours.  BNP (last 3 results) No results for input(s): BNP in the last 8760 hours.  ProBNP (last 3 results) No results for input(s): PROBNP in the last 8760 hours.  CBG: No results for input(s): GLUCAP in the last 168 hours.  Radiological Exams on Admission: DG Chest 2 View  Result Date: 03/15/2020 CLINICAL DATA:  Shortness of breath EXAM: CHEST - 2 VIEW COMPARISON:  02/22/2019 FINDINGS: The heart size and mediastinal contours are within normal limits. Both lungs are clear. The visualized skeletal structures are unremarkable. IMPRESSION: No active cardiopulmonary disease. Electronically Signed   By: Ulyses Jarred M.D.   On: 03/15/2020 02:01    EKG: I independently viewed the EKG done and my findings are as followed: Sinus rhythm at a rate of 87bpm  Assessment/Plan Present on Admission: . Asthma exacerbation  Active Problems:   Asthma exacerbation   Hyperglycemia   Mild tetrahydrocannabinol (THC) abuse   Acute asthma exacerbation Continue duo nebs, Mucinex, Solu-Medrol, azithromycin. Continue Protonix to prevent steroid-induced ulcer Continue incentive spirometry and flutter valve Continue supplemental oxygen to maintain O2 sat > 92% with plan to wean patient off oxygen as tolerated  Hyperglycemia possibly reactive Blood glucose  138; this may be due to steroid effect Continue to monitor blood glucose with morning labs  Mild THC abuse Patient was  counseled on THC cessation and she verbalized understanding the discussion.   DVT prophylaxis: Lovenox  Code Status: Full code  Family Communication: None at bedside  Disposition Plan: Plan to discharge patient home within next 24 hours pending clinical improvement  Consults called: None  Admission status: Observation    Bernadette Hoit MD [facility] Pager [phone]  If 7PM-7AM, please contact night-coverage [email or web address] Password [facility]  03/15/2020, 6:34 AM

## 2020-03-15 NOTE — ED Triage Notes (Signed)
Per ems, pt from home. Pt reports asthma exacerbation for past 3 days that has gotten worse. 90-92% on room air, now at 96-98% on 2L [place]. Pt has attempted home neb multiple times with no relief. Pt received 2g mag and 125 solumedrol with ems. Per ems, lung sounds wheezing and rhonchi. EMS VSS BP 148/96, HR 88. Also pt reporting lump/mass, moveable, warm to touch to left breast that she noticed last month that has gotten bigger and painful. 22g L AC.

## 2020-03-15 NOTE — ED Notes (Signed)
Dr Preston Fleeting informed pt POC covid neg

## 2020-03-15 NOTE — ED Notes (Signed)
Report called to 5C RN 

## 2020-03-15 NOTE — ED Notes (Signed)
ED Provider at bedside. 

## 2020-03-15 NOTE — ED Provider Notes (Signed)
[facility] EMERGENCY DEPARTMENT Provider Note   CSN: [id number] Arrival date & time: 03/15/20  0104     History Chief Complaint  Patient presents with  . Shortness of Breath    Rebekah Evans is a 39 y.o. female.   39 year old female with a history of asthma presents to the emergency department for complaints of shortness of breath.  Shortness of breath has been worsening over the past 3 days.  Symptoms wax and wane in severity.  Initially were temporarily improved with home albuterol treatments, but this has provided less relief tonight.  Noted to have diffuse expiratory wheezing on EMS arrival.  She last used nebulizer treatment 1 hour ago.  Given 2 g magnesium and 125 mg Solu-Medrol in route to the ED.  Patient complains of associated chest tightness with spastic, nonproductive cough.  Denies fever, sick contacts, hemoptysis, leg swelling, syncope, chest pain.  She was last admitted for asthma exacerbation in 02/2019.  No history of intubations secondary to asthma.  See chart MRN [id number] for additional PMH; patient with two charts requiring merge.  The history is provided by the patient. No language interpreter was used.  Shortness of Breath      Past Medical History:  Diagnosis Date  . Asthma     There are no problems to display for this patient.   ** The histories are not reviewed yet. Please review them in the "History" navigator section and refresh this SmartLink.   OB History   No obstetric history on file.     No family history on file.  Social History   Tobacco Use  . Smoking status: Former Smoker    Packs/day: 1.00    Years: 25.00    Pack years: 25.00    Types: Cigarettes    Quit date: 01/15/2020    Years since quitting: 0.1  . Smokeless tobacco: Never Used  Substance Use Topics  . Alcohol use: Yes    Comment: socially  . Drug use: Yes    Types: Marijuana    Comment: socially    Home Medications Prior to Admission medications    Not on File    Allergies    Patient has no known allergies.  Review of Systems   Review of Systems  Respiratory: Positive for shortness of breath.   Ten systems reviewed and are negative for acute change, except as noted in the HPI.    Physical Exam Updated Vital Signs BP (!) 143/68   Pulse 98   Temp 98.3 F (36.8 C) (Oral)   Resp (!) 21   Ht 5\' 8"  (1.727 m)   Wt 131.5 kg   LMP 02/26/2020   SpO2 94%   BMI 44.09 kg/m   Physical Exam Vitals and nursing note reviewed.  Constitutional:      General: She is not in acute distress.    Appearance: She is well-developed. She is not diaphoretic.     Comments: Obese AA female  HENT:     Head: Normocephalic and atraumatic.  Eyes:     General: No scleral icterus.    Conjunctiva/sclera: Conjunctivae normal.  Cardiovascular:     Rate and Rhythm: Normal rate and regular rhythm.     Pulses: Normal pulses.  Pulmonary:     Effort: Pulmonary effort is normal. No respiratory distress.     Breath sounds: Wheezing present.     Comments: Diffuse expiratory wheezing. Dyspnea with tachypnea. SpO2 96% on 2L via [place]. No distress. Chest:  Musculoskeletal:        General: Normal range of motion.     Cervical back: Normal range of motion.     Right lower leg: No edema.     Left lower leg: No edema.  Skin:    General: Skin is warm and dry.     Coloration: Skin is not pale.     Findings: No erythema or rash.  Neurological:     Mental Status: She is alert and oriented to person, place, and time.     Coordination: Coordination normal.  Psychiatric:        Behavior: Behavior normal.     ED Results / Procedures / Treatments   Labs (all labs ordered are listed, but only abnormal results are displayed) Labs Reviewed  CBC - Abnormal; Notable for the following components:      Result Value   RBC 5.16 (*)    RDW 16.0 (*)    All other components within normal limits  BASIC METABOLIC PANEL - Abnormal; Notable for the following  components:   Glucose, Bld 138 (*)    All other components within normal limits  POC SARS CORONAVIRUS 2 AG -  ED    EKG EKG Interpretation  Date/Time:  Saturday March 15 2020 01:14:42 EDT Ventricular Rate:  87 PR Interval:    QRS Duration: 79 QT Interval:  337 QTC Calculation: 406 R Axis:   48 Text Interpretation: Sinus rhythm Normal ECG No old tracing to compare Confirmed by Dione Booze ([id number]) on 03/15/2020 1:18:11 AM   Radiology DG Chest 2 View  Result Date: 03/15/2020 CLINICAL DATA:  Shortness of breath EXAM: CHEST - 2 VIEW COMPARISON:  02/22/2019 FINDINGS: The heart size and mediastinal contours are within normal limits. Both lungs are clear. The visualized skeletal structures are unremarkable. IMPRESSION: No active cardiopulmonary disease. Electronically Signed   By: Deatra Robinson M.D.   On: 03/15/2020 02:01    Procedures .Critical Care Performed by: Antony Madura, PA-C Authorized by: Antony Madura, PA-C   Critical care provider statement:    Critical care time (minutes):  45   Critical care was necessary to treat or prevent imminent or life-threatening deterioration of the following conditions:  Respiratory failure (asthma exacerbation)   Critical care was time spent personally by me on the following activities:  Discussions with consultants, evaluation of patient's response to treatment, examination of patient, ordering and performing treatments and interventions, ordering and review of laboratory studies, ordering and review of radiographic studies, pulse oximetry, re-evaluation of patient's condition, obtaining history from patient or surrogate and review of old charts   (including critical care time)  Medications Ordered in ED Medications  albuterol (PROVENTIL,VENTOLIN) solution continuous neb (0 mg Nebulization Stopped 03/15/20 0406)  ipratropium (ATROVENT) nebulizer solution 0.5 mg (0.5 mg Nebulization Given 03/15/20 0204)  0.9 %  sodium chloride infusion ( Intravenous  Stopped 03/15/20 0406)  albuterol (PROVENTIL) (2.5 MG/3ML) 0.083% nebulizer solution 5 mg (5 mg Nebulization Given 03/15/20 0559)  ipratropium (ATROVENT) nebulizer solution 0.5 mg (0.5 mg Nebulization Given 03/15/20 0559)    ED Course  I have reviewed the triage vital signs and the nursing notes.  Pertinent labs & imaging results that were available during my care of the patient were reviewed by me and considered in my medical decision making (see chart for details).  Clinical Course as of Mar 15 604  Sat Mar 15, 2020  0400 Patient resting comfortably in the exam room bed.  She has faint expiratory wheezing,  though this has greatly improved.  No longer speaking in truncated sentences.  The patient agrees that she has had symptomatic improvement with a continuous albuterol treatment.  Will assess for hypoxia on ambulation.   [RD]  4081 Patient with mild tachypnea.  Noted to have persistent wheezing in all lung fields.  While she states that she is feeling better, she agrees that discharge may result in return to the department for recurrent worsening.  Will consult with admitting service regarding inpatient observation.   [KH]    Clinical Course User Index [KH] Beverely Pace   MDM Rules/Calculators/A&P                      39 year old female presents to the emergency department for shortness of breath x3 days. She has a known history of asthma and reports that her exacerbation feels similar to prior asthma flares. Last hospitalized for asthma exacerbation 1 year ago. She has not had any fevers and has a negative rapid Covid in the ED. Initially with borderline low oxygen saturations. Has been able to transition off of supplemental oxygen following a continuous albuterol treatment. Given Solu-Medrol and IV magnesium by EMS. Over observation in the ED wheezing has worsened. Feel the patient would benefit from inpatient observation and regular albuterol nebs. Will admit to hospitalist service for  continued management.   Final Clinical Impression(s) / ED Diagnoses Final diagnoses:  Moderate persistent asthma with exacerbation    Rx / DC Orders ED Discharge Orders    None       Antonietta Breach, PA-C 44/81/85 [id number]    Delora Fuel, MD 97/02/63 [phone]

## 2020-03-15 NOTE — Progress Notes (Signed)
Patient admitted for asthma Exacerbation This Morning.  I have seen and examined at bedside, she still has some exertional shortness of breath on 2 L nasal cannula.  Tells me this typically happens during when the season changes, does not follow-up outpatient pulmonologist routinely.  Does have nebulizers at home.  On physical exam her vital signs are stable, normal sinus rhythm, diffuse bilateral expiratory wheezing, she is morbidly obese.  Abdomen is nontender nondistended.  At this time continue scheduled and as needed bronchodilators for asthma exacerbation, continue azithromycin, IV Solu-Medrol, add Pulmicort.  Incentive spirometer and flutter valve.  Out of bed to chair.  Supportive care.  Discussed her care with the patient's RN as well.  Time spent 15 mins   Stephania Fragmin MD

## 2020-03-15 NOTE — ED Notes (Signed)
Pt gone to xray

## 2020-03-15 NOTE — Progress Notes (Signed)
Pt has left breast lump that she states "has never been looked at by a doctor before". Pt states that it is painful to touch. Paged MD. Will continue to monitor.

## 2020-03-16 LAB — PROTIME-INR
INR: 1 (ref 0.8–1.2)
Prothrombin Time: 13 seconds (ref 11.4–15.2)

## 2020-03-16 LAB — APTT: aPTT: 27 seconds (ref 24–36)

## 2020-03-16 LAB — CBC
HCT: 42.5 % (ref 36.0–46.0)
Hemoglobin: 13.5 g/dL (ref 12.0–15.0)
MCH: 26.4 pg (ref 26.0–34.0)
MCHC: 31.8 g/dL (ref 30.0–36.0)
MCV: 83.2 fL (ref 80.0–100.0)
Platelets: 312 10*3/uL (ref 150–400)
RBC: 5.11 MIL/uL (ref 3.87–5.11)
RDW: 16.1 % — ABNORMAL HIGH (ref 11.5–15.5)
WBC: 17.8 10*3/uL — ABNORMAL HIGH (ref 4.0–10.5)
nRBC: 0 % (ref 0.0–0.2)

## 2020-03-16 LAB — BASIC METABOLIC PANEL
Anion gap: 11 (ref 5–15)
BUN: 12 mg/dL (ref 6–20)
CO2: 22 mmol/L (ref 22–32)
Calcium: 9.4 mg/dL (ref 8.9–10.3)
Chloride: 103 mmol/L (ref 98–111)
Creatinine, Ser: 0.93 mg/dL (ref 0.44–1.00)
GFR calc Af Amer: >60 mL/min (ref >60)
GFR calc non Af Amer: >60 mL/min (ref >60)
Glucose, Bld: 138 mg/dL — ABNORMAL HIGH (ref 70–99)
Potassium: 4.1 mmol/L (ref 3.5–5.1)
Sodium: 136 mmol/L (ref 135–145)

## 2020-03-16 LAB — PHOSPHORUS: Phosphorus: 2.5 mg/dL (ref 2.5–4.6)

## 2020-03-16 LAB — MAGNESIUM: Magnesium: 1.7 mg/dL (ref 1.7–2.4)

## 2020-03-16 MED ORDER — IPRATROPIUM-ALBUTEROL 0.5-2.5 (3) MG/3ML IN SOLN: 3.0000 mL | RESPIRATORY_TRACT | 0 refills | Status: DC | PRN

## 2020-03-16 MED ORDER — AZITHROMYCIN 250 MG PO TABS: 250.0000 mg | ORAL_TABLET | Freq: Every day | ORAL | 0 refills | Status: AC

## 2020-03-16 MED ORDER — PREDNISONE 50 MG PO TABS: 50.0000 mg | ORAL_TABLET | Freq: Every day | ORAL | 0 refills | Status: AC

## 2020-03-16 MED ORDER — ALBUTEROL SULFATE HFA 108 (90 BASE) MCG/ACT IN AERS: 2.0000 | INHALATION_SPRAY | Freq: Four times a day (QID) | RESPIRATORY_TRACT | 0 refills | Status: DC | PRN

## 2020-03-16 NOTE — TOC Transition Note (Signed)
Transition of Care [facility]) - CM/SW Discharge Note   Patient Details  Name: Rebekah Evans MRN: [id number] Date of Birth: Apr 18, 1981  [facility] CM/SW Contact:  Kermit Balo, RN Phone Number: 03/16/2020, 12:19 PM   Clinical Narrative:    Pt discharging home with self care. Pt without a PCP. CM provided her information on a couple of the [facility] to call for an appt. CM also provided her information on the [facility] pharmacy for medication assistance.  CM provided her a MATCH to assist with the cost of her d/c meds.  CM provided cab voucher for assistance in transportation home.  No further needs per TOC.   Final next level of care: Home/Self Care Barriers to Discharge: Inadequate or no insurance, Barriers Unresolved (comment)   Patient Goals and CMS Choice        Discharge Placement                       Discharge Plan and Services                                     Social Determinants of Health (SDOH) Interventions     Readmission Risk Interventions No flowsheet data found.

## 2020-03-16 NOTE — Discharge Summary (Signed)
Physician Discharge Summary  Rebekah Evans EXH:[id number] DOB: 05-16-1981 DOA: 03/15/2020  PCP: Patient, No Pcp Per  Admit date: 03/15/2020 Discharge date: 03/16/2020  Admitted From: Home Disposition: Home  Recommendations for Outpatient Follow-up:  1. Follow up with PCP in 1-2 weeks 2. Please obtain BMP/CBC in one week your next doctors visit.  3. P.o. prednisone and azithromycin prescribed for 3 days 4. Bronchodilators prescribed.   Discharge Condition: Stable CODE STATUS: Full Diet recommendation: Regular  Brief/Interim Summary:  39 y.o. female with medical history significant for asthma and THC use who presents to the emergency department due to 3-day onset of increasing shortness of breath and nonproductive cough.  She was diagnosed with asthma exacerbation.  Rest of the work-up was negative.  She was started on bronchodilators, azithromycin and steroids.  Over the course of 24 hours her symptoms started improving, she was weaned off oxygen down to room air.  She was ambulating in the hallway without any issues. Today she is deemed medically stable to be discharged with outpatient follow-up recommendations as stated above.  Acute asthma exacerbation Doing much better, hypoxia resolved.  We will discharge her home on p.o. prednisone and azithromycin for 3 days.  Bronchodilators prescribed.  Advised to follow-up outpatient with PCP.  Marijuana use Patient was counseled on THC cessation and she verbalized understanding the discussion.  Left breast nodule -Reported by the nursing staff and the patient.  Patient would want this to be followed up outpatient with the primary care physician.  She does not have one in town therefore is given information for community wellness center until she establishes one  Body mass index is 44.09 kg/m. She would benefit from outpatient weight loss program.  Discharge Diagnoses:  Active Problems:   Asthma exacerbation   Hyperglycemia   Mild  tetrahydrocannabinol (THC) abuse    Consultations:  None  Subjective: Feels much better ambulating without any shortness of breath.  Discharge Exam: Vitals:   03/16/20 0908 03/16/20 0911  BP:    Pulse: 90   Resp: 18   Temp:    SpO2: 95% 95%   Vitals:   03/15/20 2356 03/16/20 0610 03/16/20 0908 03/16/20 0911  BP: (!) 145/82 (!) 125/96    Pulse: 84 94 90   Resp:   18   Temp: 98.1 F (36.7 C) 98.4 F (36.9 C)    TempSrc: Oral Oral    SpO2: 94% 95% 95% 95%  Weight:      Height:        General: Pt is alert, awake, not in acute distress Cardiovascular: RRR, S1/S2 +, no rubs, no gallops Respiratory: CTA bilaterally, no wheezing, no rhonchi Abdominal: Soft, NT, ND, bowel sounds + Extremities: no edema, no cyanosis  Discharge Instructions   Allergies as of 03/16/2020   No Known Allergies     Medication List    TAKE these medications   albuterol 108 (90 Base) MCG/ACT inhaler Commonly known as: VENTOLIN HFA Inhale 2 puffs into the lungs every 6 (six) hours as needed for wheezing or shortness of breath.   azithromycin 250 MG tablet Commonly known as: ZITHROMAX Take 1 tablet (250 mg total) by mouth daily for 3 days. Notes to patient: 03/17/20   ipratropium-albuterol 0.5-2.5 (3) MG/3ML Soln Commonly known as: DUONEB Take 3 mLs by nebulization every 4 (four) hours as needed.   predniSONE 50 MG tablet Commonly known as: DELTASONE Take 1 tablet (50 mg total) by mouth daily with breakfast for 3 days. Notes to patient: 03/16/20  Follow-up Information    [facility]. Schedule an appointment as soon as possible for a visit in 1 week(s).   Contact information: [address] [place] [address] [phone]         No Known Allergies  You were cared for by a hospitalist during your hospital stay. If you have any questions about your discharge medications or the care you received while you were in the  hospital after you are discharged, you can call the unit and asked to speak with the hospitalist on call if the hospitalist that took care of you is not available. Once you are discharged, your primary care physician will handle any further medical issues. Please note that no refills for any discharge medications will be authorized once you are discharged, as it is imperative that you return to your primary care physician (or establish a relationship with a primary care physician if you do not have one) for your aftercare needs so that they can reassess your need for medications and monitor your lab values.   Procedures/Studies: DG Chest 2 View  Result Date: 03/15/2020 CLINICAL DATA:  Shortness of breath EXAM: CHEST - 2 VIEW COMPARISON:  02/22/2019 FINDINGS: The heart size and mediastinal contours are within normal limits. Both lungs are clear. The visualized skeletal structures are unremarkable. IMPRESSION: No active cardiopulmonary disease. Electronically Signed   By: Deatra Robinson M.D.   On: 03/15/2020 02:01      The results of significant diagnostics from this hospitalization (including imaging, microbiology, ancillary and laboratory) are listed below for reference.     Microbiology: Recent Results (from the past 240 hour(s))  SARS CORONAVIRUS 2 (TAT 6-24 HRS) Nasopharyngeal Nasopharyngeal Swab     Status: None   Collection Time: 03/15/20  7:42 AM   Specimen: Nasopharyngeal Swab  Result Value Ref Range Status   SARS Coronavirus 2 NEGATIVE NEGATIVE Final    Comment: (NOTE) SARS-CoV-2 target nucleic acids are NOT DETECTED. The SARS-CoV-2 RNA is generally detectable in upper and lower respiratory specimens during the acute phase of infection. Negative results do not preclude SARS-CoV-2 infection, do not rule out co-infections with other pathogens, and should not be used as the sole basis for treatment or other patient management decisions. Negative results must be combined with clinical  observations, patient history, and epidemiological information. The expected result is Negative. Fact Sheet for Patients: HairSlick.no Fact Sheet for Healthcare Providers: quierodirigir.com This test is not yet approved or cleared by the [place] FDA and  has been authorized for detection and/or diagnosis of SARS-CoV-2 by FDA under an Emergency Use Authorization (EUA). This EUA will remain  in effect (meaning this test can be used) for the duration of the COVID-19 declaration under Section 56 4(b)(1) of the Act, 21 U.S.C. section 360bbb-3(b)(1), unless the authorization is terminated or revoked sooner. Performed at [facility], [address] [place], [place] [address]      Labs: BNP (last 3 results) Recent Labs    03/15/20 0214  BNP 42.6   Basic Metabolic Panel: Recent Labs  Lab 03/15/20 0214 03/16/20 0238  NA 139 136  K 3.9 4.1  CL 107 103  CO2 24 22  GLUCOSE 138* 138*  BUN 12 12  CREATININE 0.98 0.93  CALCIUM 8.9 9.4  MG  --  1.7  PHOS  --  2.5   Liver Function Tests: No results for input(s): AST, ALT, ALKPHOS, BILITOT, PROT, ALBUMIN in the last 168  hours. No results for input(s): LIPASE, AMYLASE in the last 168 hours. No results for input(s): AMMONIA in the last 168 hours. CBC: Recent Labs  Lab 03/15/20 0214 03/16/20 0238  WBC 6.3 17.8*  HGB 13.7 13.5  HCT 44.4 42.5  MCV 86.0 83.2  PLT 289 312   Cardiac Enzymes: No results for input(s): CKTOTAL, CKMB, CKMBINDEX, TROPONINI in the last 168 hours. BNP: Invalid input(s): POCBNP CBG: No results for input(s): GLUCAP in the last 168 hours. D-Dimer No results for input(s): DDIMER in the last 72 hours. Hgb A1c No results for input(s): HGBA1C in the last 72 hours. Lipid Profile No results for input(s): CHOL, HDL, LDLCALC, TRIG, CHOLHDL, LDLDIRECT in the last 72 hours. Thyroid function studies No results for input(s): TSH, T4TOTAL, T3FREE,  THYROIDAB in the last 72 hours.  Invalid input(s): FREET3 Anemia work up No results for input(s): VITAMINB12, FOLATE, FERRITIN, TIBC, IRON, RETICCTPCT in the last 72 hours. Urinalysis No results found for: COLORURINE, APPEARANCEUR, [place], [place], [place], [place], [place], [place], PROTEINUR, UROBILINOGEN, NITRITE, LEUKOCYTESUR Sepsis Labs Invalid input(s): PROCALCITONIN,  WBC,  LACTICIDVEN Microbiology Recent Results (from the past 240 hour(s))  SARS CORONAVIRUS 2 (TAT 6-24 HRS) Nasopharyngeal Nasopharyngeal Swab     Status: None   Collection Time: 03/15/20  7:42 AM   Specimen: Nasopharyngeal Swab  Result Value Ref Range Status   SARS Coronavirus 2 NEGATIVE NEGATIVE Final    Comment: (NOTE) SARS-CoV-2 target nucleic acids are NOT DETECTED. The SARS-CoV-2 RNA is generally detectable in upper and lower respiratory specimens during the acute phase of infection. Negative results do not preclude SARS-CoV-2 infection, do not rule out co-infections with other pathogens, and should not be used as the sole basis for treatment or other patient management decisions. Negative results must be combined with clinical observations, patient history, and epidemiological information. The expected result is Negative. Fact Sheet for Patients: SugarRoll.be Fact Sheet for Healthcare Providers: https://www.woods-mathews.com/ This test is not yet approved or cleared by the [place] FDA and  has been authorized for detection and/or diagnosis of SARS-CoV-2 by FDA under an Emergency Use Authorization (EUA). This EUA will remain  in effect (meaning this test can be used) for the duration of the COVID-19 declaration under Section 56 4(b)(1) of the Act, 21 U.S.C. section 360bbb-3(b)(1), unless the authorization is terminated or revoked sooner. Performed at [facility], [place] [address], [place], [place] [address]      Time coordinating discharge:   I have spent 35 minutes face to face with the patient and on the ward discussing the patients care, assessment, plan and disposition with other care givers. >50% of the time was devoted counseling the patient about the risks and benefits of treatment/Discharge disposition and coordinating care.   SIGNED:   Damita Lack, MD  [facility] 03/16/2020, 10:23 AM   If 7PM-7AM, please contact night-coverage

## 2020-10-14 ENCOUNTER — Emergency Department (HOSPITAL_COMMUNITY): Payer: Self-pay

## 2020-10-14 ENCOUNTER — Encounter (HOSPITAL_COMMUNITY): Payer: Self-pay | Admitting: Pediatrics

## 2020-10-14 ENCOUNTER — Inpatient Hospital Stay (HOSPITAL_COMMUNITY)
Admission: EM | Admit: 2020-10-14 | Discharge: 2020-10-16 | DRG: 203 | Disposition: A | Discharge status: Home / Self Care | Payer: Self-pay | Attending: Internal Medicine | Admitting: Internal Medicine

## 2020-10-14 ENCOUNTER — Other Ambulatory Visit: Payer: Self-pay

## 2020-10-14 DIAGNOSIS — R079 Chest pain, unspecified: Secondary | ICD-10-CM

## 2020-10-14 DIAGNOSIS — N611 Abscess of the breast and nipple: Secondary | ICD-10-CM | POA: Diagnosis present

## 2020-10-14 DIAGNOSIS — Z6836 Body mass index (BMI) 36.0-36.9, adult: Secondary | ICD-10-CM

## 2020-10-14 DIAGNOSIS — Z87891 Personal history of nicotine dependence: Secondary | ICD-10-CM

## 2020-10-14 DIAGNOSIS — J45901 Unspecified asthma with (acute) exacerbation: Principal | ICD-10-CM | POA: Diagnosis present

## 2020-10-14 DIAGNOSIS — E669 Obesity, unspecified: Secondary | ICD-10-CM | POA: Diagnosis present

## 2020-10-14 DIAGNOSIS — L0291 Cutaneous abscess, unspecified: Secondary | ICD-10-CM

## 2020-10-14 DIAGNOSIS — Z79899 Other long term (current) drug therapy: Secondary | ICD-10-CM

## 2020-10-14 DIAGNOSIS — Z20822 Contact with and (suspected) exposure to covid-19: Secondary | ICD-10-CM | POA: Diagnosis present

## 2020-10-14 LAB — I-STAT BETA HCG BLOOD, ED (MC, WL, AP ONLY): I-stat hCG, quantitative: <5 m[IU]/mL (ref <5)

## 2020-10-14 LAB — CBC
HCT: 50.7 % — ABNORMAL HIGH (ref 36.0–46.0)
Hemoglobin: 15.7 g/dL — ABNORMAL HIGH (ref 12.0–15.0)
MCH: 28.1 pg (ref 26.0–34.0)
MCHC: 31 g/dL (ref 30.0–36.0)
MCV: 90.9 fL (ref 80.0–100.0)
Platelets: 325 10*3/uL (ref 150–400)
RBC: 5.58 MIL/uL — ABNORMAL HIGH (ref 3.87–5.11)
RDW: 15.9 % — ABNORMAL HIGH (ref 11.5–15.5)
WBC: 14 10*3/uL — ABNORMAL HIGH (ref 4.0–10.5)
nRBC: 0 % (ref 0.0–0.2)

## 2020-10-14 LAB — TROPONIN I (HIGH SENSITIVITY)
Troponin I (High Sensitivity): 5 ng/L (ref <18)
Troponin I (High Sensitivity): 8 ng/L (ref <18)

## 2020-10-14 MED ORDER — ALBUTEROL SULFATE (2.5 MG/3ML) 0.083% IN NEBU
5.0000 mg | INHALATION_SOLUTION | Freq: Once | RESPIRATORY_TRACT | Status: AC
  Administered 2020-10-14: 5 mg via RESPIRATORY_TRACT
  Filled 2020-10-14: qty 6

## 2020-10-14 NOTE — ED Triage Notes (Signed)
Patient stated her chest hurts and not just the abscess on left breast.

## 2020-10-14 NOTE — ED Notes (Signed)
Pt placed on 2L O2 [place] per RN, Reita Cliche. Pt's O2 saturation is at 94% on the 2L of O2.

## 2020-10-14 NOTE — ED Triage Notes (Signed)
C/O painful swelling on left breast. Stated progressively gotten worst since yesterday. Denies PMH x asthma.

## 2020-10-15 ENCOUNTER — Encounter (HOSPITAL_COMMUNITY): Payer: Self-pay | Admitting: Internal Medicine

## 2020-10-15 DIAGNOSIS — E669 Obesity, unspecified: Secondary | ICD-10-CM | POA: Diagnosis present

## 2020-10-15 DIAGNOSIS — N611 Abscess of the breast and nipple: Secondary | ICD-10-CM

## 2020-10-15 LAB — BASIC METABOLIC PANEL
Anion gap: 10 (ref 5–15)
BUN: 9 mg/dL (ref 6–20)
CO2: 21 mmol/L — ABNORMAL LOW (ref 22–32)
Calcium: 8.6 mg/dL — ABNORMAL LOW (ref 8.9–10.3)
Chloride: 104 mmol/L (ref 98–111)
Creatinine, Ser: 1.05 mg/dL — ABNORMAL HIGH (ref 0.44–1.00)
GFR, Estimated: >60 mL/min (ref >60)
Glucose, Bld: 161 mg/dL — ABNORMAL HIGH (ref 70–99)
Potassium: 4.1 mmol/L (ref 3.5–5.1)
Sodium: 135 mmol/L (ref 135–145)

## 2020-10-15 LAB — HIV ANTIBODY (ROUTINE TESTING W REFLEX): HIV Screen 4th Generation wRfx: NONREACTIVE

## 2020-10-15 LAB — RESPIRATORY PANEL BY RT PCR (FLU A&B, COVID)
Influenza A by PCR: NEGATIVE
Influenza B by PCR: NEGATIVE
SARS Coronavirus 2 by RT PCR: NEGATIVE

## 2020-10-15 LAB — MAGNESIUM: Magnesium: 2.4 mg/dL (ref 1.7–2.4)

## 2020-10-15 LAB — TROPONIN I (HIGH SENSITIVITY): Troponin I (High Sensitivity): 7 ng/L (ref <18)

## 2020-10-15 MED ORDER — ALBUTEROL SULFATE (2.5 MG/3ML) 0.083% IN NEBU: 2.5000 mg | INHALATION_SOLUTION | RESPIRATORY_TRACT | Status: DC | PRN

## 2020-10-15 MED ORDER — ENOXAPARIN SODIUM 40 MG/0.4ML ~~LOC~~ SOLN
40.0000 mg | SUBCUTANEOUS | Status: DC
  Administered 2020-10-15 – 2020-10-16 (×2): 40 mg via SUBCUTANEOUS
  Filled 2020-10-15 (×2): qty 0.4

## 2020-10-15 MED ORDER — MAGNESIUM SULFATE 2 GM/50ML IV SOLN
2.0000 g | Freq: Once | INTRAVENOUS | Status: AC
  Administered 2020-10-15: 2 g via INTRAVENOUS
  Filled 2020-10-15: qty 50

## 2020-10-15 MED ORDER — PREDNISONE 20 MG PO TABS
40.0000 mg | ORAL_TABLET | Freq: Every day | ORAL | Status: DC
  Administered 2020-10-16: 40 mg via ORAL
  Filled 2020-10-15: qty 2

## 2020-10-15 MED ORDER — IPRATROPIUM-ALBUTEROL 0.5-2.5 (3) MG/3ML IN SOLN
3.0000 mL | Freq: Four times a day (QID) | RESPIRATORY_TRACT | Status: DC
  Administered 2020-10-15 – 2020-10-16 (×5): 3 mL via RESPIRATORY_TRACT
  Filled 2020-10-15 (×4): qty 3

## 2020-10-15 MED ORDER — SODIUM CHLORIDE 0.9 % IV BOLUS
1000.0000 mL | Freq: Once | INTRAVENOUS | Status: AC
  Administered 2020-10-15: 1000 mL via INTRAVENOUS

## 2020-10-15 MED ORDER — METHYLPREDNISOLONE SODIUM SUCC 125 MG IJ SOLR
125.0000 mg | Freq: Once | INTRAMUSCULAR | Status: AC
  Administered 2020-10-15: 125 mg via INTRAVENOUS
  Filled 2020-10-15: qty 2

## 2020-10-15 MED ORDER — IPRATROPIUM-ALBUTEROL 0.5-2.5 (3) MG/3ML IN SOLN
3.0000 mL | Freq: Once | RESPIRATORY_TRACT | Status: AC
  Administered 2020-10-15: 3 mL via RESPIRATORY_TRACT
  Filled 2020-10-15: qty 3

## 2020-10-15 MED ORDER — SODIUM CHLORIDE 0.45 % IV SOLN: INTRAVENOUS | Status: AC

## 2020-10-15 MED ORDER — ACETAMINOPHEN 325 MG PO TABS
650.0000 mg | ORAL_TABLET | Freq: Once | ORAL | Status: AC
  Administered 2020-10-15: 650 mg via ORAL
  Filled 2020-10-15: qty 2

## 2020-10-15 MED ORDER — METHYLPREDNISOLONE SODIUM SUCC 40 MG IJ SOLR
40.0000 mg | Freq: Two times a day (BID) | INTRAMUSCULAR | Status: AC
  Administered 2020-10-15 (×2): 40 mg via INTRAVENOUS
  Filled 2020-10-15 (×2): qty 1

## 2020-10-15 MED ORDER — ALBUTEROL SULFATE HFA 108 (90 BASE) MCG/ACT IN AERS
6.0000 | INHALATION_SPRAY | Freq: Once | RESPIRATORY_TRACT | Status: AC
  Administered 2020-10-15: 6 via RESPIRATORY_TRACT
  Filled 2020-10-15: qty 6.7

## 2020-10-15 MED ORDER — IPRATROPIUM BROMIDE HFA 17 MCG/ACT IN AERS
2.0000 | INHALATION_SPRAY | Freq: Once | RESPIRATORY_TRACT | Status: AC
  Administered 2020-10-15: 2 via RESPIRATORY_TRACT
  Filled 2020-10-15: qty 12.9

## 2020-10-15 MED ORDER — BUPIVACAINE HCL (PF) 0.5 % IJ SOLN
10.0000 mL | Freq: Once | INTRAMUSCULAR | Status: AC
  Administered 2020-10-15: 10 mL
  Filled 2020-10-15: qty 10

## 2020-10-15 MED ORDER — AEROCHAMBER PLUS FLO-VU LARGE MISC
Status: AC
  Filled 2020-10-15: qty 1

## 2020-10-15 NOTE — Progress Notes (Signed)
SATURATION QUALIFICATIONS: (This note is used to comply with regulatory documentation for home oxygen)  Patient Saturations on 2L  at Rest on arrival = 88%  Patient Saturations on Room Air while Ambulating = 91%  Please briefly explain why patient needs home oxygen:Pt desats on oxygen when sleeping. Did not desaturate when sitting, standing or walking on RA.   Thanks.  Dainelle Hun W,PT Acute Rehabilitation Services Pager:  [phone]  Office:  [phone]

## 2020-10-15 NOTE — H&P (Signed)
History and Physical    Rebekah Evans WER:[id number] DOB: [birth year] DOA: 10/14/2020  PCP: Patient, No Pcp Per   Patient coming from: Home  I have personally briefly reviewed patient's old medical records in [facility]  Chief Complaint: Chest pain                               Painful swelling left breast  HPI: Rebekah Evans is a 39 y.o. female with medical history significant for morbid obesity, asthma who presents to the ER for evaluation of chest pain as well as painful swelling involving the inner left breast.  Rebekah Evans describes the chest pain as a pleuritic pain which is worse with any form of movement or when Rebekah Evans takes a deep breath.  Rebekah Evans denies having any fever or chills.  Pleuritic chest pain is associated with shortness of breath, a dry cough and wheezing.  Rebekah Evans has has had nebulizer at home without any improvement in her symptoms. Rebekah Evans is unvaccinated against Covid and denies having any sick contacts.  Rebekah Evans has never been intubated for an acute asthma exacerbation.  Since Rebekah Evans complains of pain involving her left breast which Rebekah Evans rates 10 x 10 in intensity at its worst.  Pain and swelling has progressively worsened over the last 2 days. In the emergency room patient was noted to be tachypneic with respiratory rate of 2600 and pulse oximetry between 90 and 92%.  Patient was able to speak in full sentences. Rebekah Evans was also noted to have a 5 x 5 cm fluctuant abscess to the left midline side of the breast which was drained in the emergency room. Rebekah Evans denies having any abdominal pain, no nausea, no vomiting, no changes in her bowel habits, no urinary symptoms, no dizziness, no lightheadedness, no headaches Labs show sodium 135, potassium 4.1, chloride 104, bicarb 21, glucose 161, BUN 9, creatinine 1.05, calcium 8.6, magnesium 2.4, troponin VIII, white count 14.0, hemoglobin 15.7, hematocrit 50.7, MCV 90.9, RDW 15.9, platelet count 325 Chest x-ray reviewed by me shows no active cardiopulmonary  disease Twelve-lead EKG reviewed by me shows sinus tachycardia   ED Course: Patient is a 39 year old African-American female with a history of asthma who presents to the ER for evaluation of pleuritic chest pain, shortness of breath, cough and wheezing as well as an abscess involving the left breast and is status post incision and drainage of abscess.  Rebekah Evans will be admitted to the hospital for an acute asthma exacerbation.  Review of Systems: As per HPI otherwise 10 point review of systems negative.    Past Medical History:  Diagnosis Date  . Asthma   . Medical history non-contributory     Past Surgical History:  Procedure Laterality Date  . NO PAST SURGERIES       reports that Rebekah Evans has been smoking. Rebekah Evans has been smoking about 0.50 packs per day. Rebekah Evans uses smokeless tobacco. Rebekah Evans reports current alcohol use. Rebekah Evans reports that Rebekah Evans does not use drugs.  No Known Allergies  Family History  Problem Relation Age of Onset  . Hypertension Mother   . Hypertension Father      Prior to Admission medications   Medication Sig Start Date End Date Taking? Authorizing Provider  ipratropium-albuterol (DUONEB) 0.5-2.5 (3) MG/3ML SOLN Take 3 mLs by nebulization every 6 (six) hours as needed. 02/25/19  Yes [name], MD  albuterol (PROVENTIL HFA;VENTOLIN HFA) 108 (90 Base) MCG/ACT inhaler Inhale 2  puffs into the lungs every 6 (six) hours as needed for wheezing or shortness of breath. Patient not taking: Reported on 10/15/2020 11/18/18   Marguerita Merles Latif, DO  benzonatate (TESSALON) 100 MG capsule Take 2 capsules (200 mg total) by mouth 3 (three) times daily as needed for cough. Patient not taking: Reported on 10/15/2020 02/25/19   [name], MD  folic acid (FOLVITE) 1 MG tablet Take 1 tablet (1 mg total) by mouth daily. Patient not taking: Reported on 02/22/2019 11/18/18   Marguerita Merles Latif, DO  Multiple Vitamin (MULTIVITAMIN WITH MINERALS) TABS tablet Take 1 tablet by mouth daily. Patient not  taking: Reported on 10/15/2020 02/25/19   [name], MD  predniSONE (DELTASONE) 20 MG tablet Prednisone 60 mg daily for 3 days followed by  Prednisone 40 mg daily for 3 days followed by  Prednisone 20 mg daily for 3 days. Patient not taking: Reported on 10/15/2020 02/25/19   [name], MD    Physical Exam: Vitals:   10/15/20 0630 10/15/20 0645 10/15/20 0800 10/15/20 0830  BP: (!) 141/78 128/78 (!) 153/98 (!) 163/108  Pulse: 96 98 (!) 102 94  Resp: 19 20 19 17   Temp:      TempSrc:      SpO2: 95% 93% 93% 92%  Weight:      Height:         Vitals:   10/15/20 0630 10/15/20 0645 10/15/20 0800 10/15/20 0830  BP: (!) 141/78 128/78 (!) 153/98 (!) 163/108  Pulse: 96 98 (!) 102 94  Resp: 19 20 19 17   Temp:      TempSrc:      SpO2: 95% 93% 93% 92%  Weight:      Height:        Constitutional: NAD, alert and oriented x 3.  Morbidly obese Eyes: PERRL, lids and conjunctivae normal ENMT: Mucous membranes are moist.  Neck: normal, supple, no masses, no thyromegaly Respiratory: Diffuse wheezing, no crackles. Normal respiratory effort. No accessory muscle use.  Cardiovascular: Regular rate and rhythm, no murmurs / rubs / gallops. No extremity edema. 2+ pedal pulses. No carotid bruits.  Abdomen: no tenderness, no masses palpated. No hepatosplenomegaly. Bowel sounds positive.  Musculoskeletal: no clubbing / cyanosis. No joint deformity upper and lower extremities.  Skin: no rashes, lesions, ulcers.  Neurologic: No gross focal neurologic deficit. Psychiatric: Normal mood and affect.   Labs on Admission: I have personally reviewed following labs and imaging studies  CBC: Recent Labs  Lab 10/14/20 1847  WBC 14.0*  HGB 15.7*  HCT 50.7*  MCV 90.9  PLT 325   Basic Metabolic Panel: Recent Labs  Lab 10/15/20 0411  NA 135  K 4.1  CL 104  CO2 21*  GLUCOSE 161*  BUN 9  CREATININE 1.05*  CALCIUM 8.6*  MG 2.4   GFR: Estimated Creatinine Clearance: 92.2 mL/min (A) (by C-G  formula based on SCr of 1.05 mg/dL (H)). Liver Function Tests: No results for input(s): AST, ALT, ALKPHOS, BILITOT, PROT, ALBUMIN in the last 168 hours. No results for input(s): LIPASE, AMYLASE in the last 168 hours. No results for input(s): AMMONIA in the last 168 hours. Coagulation Profile: No results for input(s): INR, PROTIME in the last 168 hours. Cardiac Enzymes: No results for input(s): CKTOTAL, CKMB, CKMBINDEX, TROPONINI in the last 168 hours. BNP (last 3 results) No results for input(s): PROBNP in the last 8760 hours. HbA1C: No results for input(s): HGBA1C in the last 72 hours. CBG: No results for input(s): GLUCAP  in the last 168 hours. Lipid Profile: No results for input(s): CHOL, HDL, LDLCALC, TRIG, CHOLHDL, LDLDIRECT in the last 72 hours. Thyroid Function Tests: No results for input(s): TSH, T4TOTAL, FREET4, T3FREE, THYROIDAB in the last 72 hours. Anemia Panel: No results for input(s): VITAMINB12, FOLATE, FERRITIN, TIBC, IRON, RETICCTPCT in the last 72 hours. Urine analysis:    Component Value Date/Time   COLORURINE YELLOW 10/07/2016 1455   APPEARANCEUR HAZY (A) 10/07/2016 1455   LABSPEC 1.020 10/07/2016 1455   PHURINE 6.0 10/07/2016 1455   GLUCOSEU NEGATIVE 10/07/2016 1455   HGBUR LARGE (A) 10/07/2016 1455   BILIRUBINUR NEGATIVE 10/07/2016 1455   KETONESUR NEGATIVE 10/07/2016 1455   PROTEINUR NEGATIVE 10/07/2016 1455   NITRITE NEGATIVE 10/07/2016 1455   LEUKOCYTESUR NEGATIVE 10/07/2016 1455    Radiological Exams on Admission: DG Chest 2 View  Result Date: 10/14/2020 CLINICAL DATA:  Chest pain EXAM: CHEST - 2 VIEW COMPARISON:  03/15/2020 FINDINGS: The heart size and mediastinal contours are within normal limits. Both lungs are clear. The visualized skeletal structures are unremarkable. IMPRESSION: No active cardiopulmonary disease. Electronically Signed   By: Helyn Numbers MD   On: 10/14/2020 19:21    EKG: Independently reviewed.  Sinus  tachycardia  Assessment/Plan Active Problems:   Acute asthma exacerbation   Left breast abscess   Obesity (BMI 30-39.9)    Acute asthma exacerbation Place patient on scheduled and inhaled bronchodilator therapy Place patient on systemic steroids Continue oxygen supplementation to maintain pulse oximetry > 92%   Left breast abscess S/p incision and drainage Continue daily dressing   Obesity (BMI 36) Complicates overall prognosis and care   DVT prophylaxis: Lovenox Code Status: Full code Family Communication: Greater than 50% of time was spent discussing plan of care with patient at the bedside.  All questions and concerns have been addressed.  Rebekah Evans verbalizes understanding and agrees with the plan. Disposition Plan: Back to previous home environment Consults called: None    Latricia Cerrito MD [facility]     10/15/2020, 9:04 AM

## 2020-10-15 NOTE — Plan of Care (Signed)

## 2020-10-15 NOTE — Evaluation (Signed)
Physical Therapy Evaluation Patient Details Name: Rebekah Evans MRN: [id number] DOB: 1981/02/01 Today's Date: 10/15/2020   History of Present Illness  Rebekah Evans is a 39 y.o. female with medical history significant for morbid obesity, asthma who presents to the ER for evaluation of chest pain as well as painful swelling involving the inner left breast.  She describes the chest pain as a pleuritic pain which is worse with any form of movement or when she takes a deep breath.  She denies having any fever or chills.  Pleuritic chest pain is associated with shortness of breath, a dry cough and wheezing.  She has has had nebulizer at home without any improvement in her symptoms.She complains of pain involving her left breast which she rates 10 x 10 in intensity at its worst.  Pain and swelling has progressively worsened over the last 2 days.  Clinical Impression  Pt admitted with above diagnosis. Pt was able to ambulate in hallway and did not need O2 to maintain sats.  Pt did however need O2 at rest when sleeping it appears as her O2 at rest sleeping on arrival was 88%.  Pt has flight of stairs at home and will need PT to follow to address steps when pt is ready. She was fearful to try this am.   Pt currently with functional limitations due to the deficits listed below (see PT Problem List). Pt will benefit from skilled PT to increase their independence and safety with mobility to allow discharge to the venue listed below.      Follow Up Recommendations No PT follow up;Supervision - Intermittent    Equipment Recommendations  Other (comment) (? home O2)    Recommendations for Other Services       Precautions / Restrictions Precautions Precautions: None Restrictions Weight Bearing Restrictions: No      Mobility  Bed Mobility Overal bed mobility: Needs Assistance Bed Mobility: Supine to Sit     Supine to sit: Independent          Transfers Overall transfer level: Independent                   Ambulation/Gait Ambulation/Gait assistance: Supervision Gait Distance (Feet): 250 Feet Assistive device: None Gait Pattern/deviations: Step-through pattern;Decreased stride length   Gait velocity interpretation: <1.31 ft/sec, indicative of household ambulator General Gait Details: Pt was able to ambulate on unit and did not need Oxygen  to maintain sats with activity however pt desat on arrival to 88% when sleeping on arrival.   Stairs            Wheelchair Mobility    Modified Rankin (Stroke Patients Only)       Balance Overall balance assessment: Needs assistance         Standing balance support: No upper extremity supported;During functional activity Standing balance-Leahy Scale: Good Standing balance comment: Pt steady wihtout device                             Pertinent Vitals/Pain Pain Assessment: Faces Faces Pain Scale: Hurts even more Pain Location: left breast Pain Descriptors / Indicators: Grimacing;Guarding Pain Intervention(s): Limited activity within patient's tolerance;Monitored during session;Repositioned    Home Living Family/patient expects to be discharged to:: Private residence Living Arrangements: Children (5 children [phone], 12) Available Help at Discharge: Family;Available PRN/intermittently;Available 24 hours/day (39 year old works at night) Type of Home: Apartment Home Access: Stairs to enter Entrance Stairs-Rails: Left  Entrance Stairs-Number of Steps: flight Home Layout: One level Home Equipment: None      Prior Function Level of Independence: Independent               Hand Dominance   Dominant Hand: Right    Extremity/Trunk Assessment   Upper Extremity Assessment Upper Extremity Assessment: Defer to OT evaluation    Lower Extremity Assessment Lower Extremity Assessment: Generalized weakness    Cervical / Trunk Assessment Cervical / Trunk Assessment: Normal  Communication    Communication: No difficulties  Cognition Arousal/Alertness: Awake/alert Behavior During Therapy: WFL for tasks assessed/performed Overall Cognitive Status: Within Functional Limits for tasks assessed                                        General Comments      Exercises     Assessment/Plan    PT Assessment Patient needs continued PT services  PT Problem List Decreased activity tolerance;Decreased balance;Decreased mobility;Decreased knowledge of use of DME;Decreased safety awareness;Decreased knowledge of precautions;Cardiopulmonary status limiting activity;Obesity       PT Treatment Interventions DME instruction;Gait training;Stair training;Functional mobility training;Therapeutic activities;Therapeutic exercise;Balance training;Patient/family education    PT Goals (Current goals can be found in the Care Plan section)  Acute Rehab PT Goals Patient Stated Goal: to go home PT Goal Formulation: With patient Time For Goal Achievement: 10/29/20 Potential to Achieve Goals: Good    Frequency Min 3X/week   Barriers to discharge        Co-evaluation               AM-PAC PT "6 Clicks" Mobility  Outcome Measure Help needed turning from your back to your side while in a flat bed without using bedrails?: None Help needed moving from lying on your back to sitting on the side of a flat bed without using bedrails?: None Help needed moving to and from a bed to a chair (including a wheelchair)?: None Help needed standing up from a chair using your arms (e.g., wheelchair or bedside chair)?: None Help needed to walk in hospital room?: None Help needed climbing 3-5 steps with a railing? : A Little 6 Click Score: 23    End of Session Equipment Utilized During Treatment: Gait belt Activity Tolerance: Patient tolerated treatment well Patient left: in bed;with call bell/phone within reach Nurse Communication: Mobility status PT Visit Diagnosis: Muscle weakness  (generalized) (M62.81)    Time: 4854-6270 PT Time Calculation (min) (ACUTE ONLY): 16 min   Charges:   PT Evaluation $PT Eval Low Complexity: 1 Low          Zeeva Courser W,PT Acute Rehabilitation Services Pager:  [phone]  Office:  [phone]    Berline Lopes 10/15/2020, 1:28 PM

## 2020-10-15 NOTE — ED Notes (Signed)
Physical Therapy  in with PT

## 2020-10-15 NOTE — ED Provider Notes (Addendum)
[facility] EMERGENCY DEPARTMENT Provider Note   CSN: [id number] Arrival date & time: 10/14/20  1808     History Chief Complaint  Patient presents with  . Abscess  . Chest Pain    Rebekah Evans is a 39 y.o. female.  HPI Patient is a 39 year old female with a history of severe obesity, asthma on nebulizer treatment.   Patient states that she has a asthma history of multiple admissions to the hospital for asthma exacerbations in the past.  Has never needed to be intubated.  She states that she uses a nebulizer treatment as needed usually approximately once a week.  She states that over the past 1 week she has been using the nebulizer 4 times per day.  She states she has been feeling increasingly short of breath she has had some cough and wheezing.  She states that she has some chest pain for the past 2 days with worsening shortness of breath and wheezing and coughing.  She states that she has been getting less and less relief with her nebulizer treatments.   She also states that she has a abscess to her left breast.  She has noticed that over the past 2 days.  She states that it has gotten progressively worse more swollen and tender to touch.  She states it is extremely painful.  Patient denies any fevers, chills.  She states she has not vaccinate for Covid.  She denies any known sick contacts.  She is on no steroid inhalers.  She denies any history of pneumonia. No nausea or vomiting.  No diaphoresis. She states that she has historically been admitted to the hospital for asthma exacerbations in the past however she states that she stopped smoking 1 year ago and has had no exacerbations since that time.  No history of VTE, no history of hemoptysis, cancer, hormonal contraceptive, no immobilization last 3 days or surgery in the previous 4 weeks, she denies any unilateral limb pain or swelling.    Past Medical History:  Diagnosis Date  . Asthma   . Medical history  non-contributory     Patient Active Problem List   Diagnosis Date Noted  . Leukocytosis 02/23/2019  . Acute asthma exacerbation 02/22/2019  . Asthma exacerbation 11/16/2018  . Acute bronchitis 11/15/2018  . Tobacco abuse 11/15/2018    Past Surgical History:  Procedure Laterality Date  . NO PAST SURGERIES       OB History    Gravida  5   Para      Term      Preterm      AB      Living  5     SAB      TAB      Ectopic      Multiple      Live Births              No family history on file.  Social History   Tobacco Use  . Smoking status: Current Every Day Smoker    Packs/day: 0.50  . Smokeless tobacco: Current User  Substance Use Topics  . Alcohol use: Yes    Comment: Social  . Drug use: No    Home Medications Prior to Admission medications   Medication Sig Start Date End Date Taking? Authorizing Provider  albuterol (PROVENTIL HFA;VENTOLIN HFA) 108 (90 Base) MCG/ACT inhaler Inhale 2 puffs into the lungs every 6 (six) hours as needed for wheezing or shortness of breath. 11/18/18  Sheikh, Omair Latif, DO  benzonatate (TESSALON) 100 MG capsule Take 2 capsules (200 mg total) by mouth 3 (three) times daily as needed for cough. 02/25/19   [name], MD  folic acid (FOLVITE) 1 MG tablet Take 1 tablet (1 mg total) by mouth daily. Patient not taking: Reported on 02/22/2019 11/18/18   Marguerita Merles Latif, DO  ipratropium-albuterol (DUONEB) 0.5-2.5 (3) MG/3ML SOLN Take 3 mLs by nebulization every 6 (six) hours as needed. 02/25/19   [name], MD  Multiple Vitamin (MULTIVITAMIN WITH MINERALS) TABS tablet Take 1 tablet by mouth daily. 02/25/19   [name], MD  predniSONE (DELTASONE) 20 MG tablet Prednisone 60 mg daily for 3 days followed by  Prednisone 40 mg daily for 3 days followed by  Prednisone 20 mg daily for 3 days. 02/25/19   [name], MD    Allergies    Patient has no known allergies.  Review of Systems   Review of Systems    Constitutional: Positive for fatigue. Negative for chills and fever.  HENT: Negative for congestion.   Eyes: Negative for pain.  Respiratory: Positive for cough, chest tightness and shortness of breath.   Cardiovascular: Positive for chest pain. Negative for leg swelling.  Gastrointestinal: Negative for abdominal pain, constipation, diarrhea and vomiting.  Genitourinary: Negative for dysuria.  Musculoskeletal: Negative for myalgias.  Skin: Negative for rash.  Neurological: Negative for dizziness and headaches.    Physical Exam Updated Vital Signs BP (!) 154/87   Pulse (!) 105   Temp 98.8 F (37.1 C) (Oral)   Resp (!) 22   Ht  (1.702 m)   Wt 106.6 kg   SpO2 92%   BMI 36.81 kg/m   Physical Exam Vitals and nursing note reviewed.  Constitutional:      Appearance: She is obese.     Comments: Patient is morbidly obese 39 year old female sitting in bed with notable increased work of breathing.  She is able to speak in sentences but is keeping them short.  HENT:     Head: Normocephalic and atraumatic.     Nose: Nose normal.     Mouth/Throat:     Mouth: Mucous membranes are moist.  Eyes:     General: No scleral icterus. Cardiovascular:     Rate and Rhythm: Regular rhythm. Tachycardia present.     Pulses: Normal pulses.     Heart sounds: Normal heart sounds.     Comments: Heart rate 110-120 Pulmonary:     Effort: No respiratory distress.     Breath sounds: No wheezing.     Comments: Tachypnea with a rate of 26, SPO2 is ranging between 90 and 92% on room air. Speaking full albeit short sentences.  Increased work of breathing. Coarse lung sounds and expiratory wheezing diffusely. Abdominal:     Palpations: Abdomen is soft.     Tenderness: There is no abdominal tenderness. There is no guarding or rebound.  Musculoskeletal:     Cervical back: Normal range of motion.     Right lower leg: No edema.     Left lower leg: No edema.  Skin:    General: Skin is warm and dry.      Capillary Refill: Capillary refill takes less than 2 seconds.     Comments: 5 x 5 cm in diameter fluctuant abscess to the left midline side of breast.  From examiner position approximately at the 9 o'clock position.  Neurological:     Mental Status: She is alert. Mental status  is at baseline.  Psychiatric:        Mood and Affect: Mood normal.        Behavior: Behavior normal.     ED Results / Procedures / Treatments   Labs (all labs ordered are listed, but only abnormal results are displayed) Labs Reviewed  CBC - Abnormal; Notable for the following components:      Result Value   WBC 14.0 (*)    RBC 5.58 (*)    Hemoglobin 15.7 (*)    HCT 50.7 (*)    RDW 15.9 (*)    All other components within normal limits  BASIC METABOLIC PANEL - Abnormal; Notable for the following components:   CO2 21 (*)    Glucose, Bld 161 (*)    Creatinine, Ser 1.05 (*)    Calcium 8.6 (*)    All other components within normal limits  RESPIRATORY PANEL BY RT PCR (FLU A&B, COVID)  MAGNESIUM  I-STAT BETA HCG BLOOD, ED ([facility], [facility], [facility] ONLY)  TROPONIN I (HIGH SENSITIVITY)  TROPONIN I (HIGH SENSITIVITY)  TROPONIN I (HIGH SENSITIVITY)    EKG None  Radiology DG Chest 2 View  Result Date: 10/14/2020 CLINICAL DATA:  Chest pain EXAM: CHEST - 2 VIEW COMPARISON:  03/15/2020 FINDINGS: The heart size and mediastinal contours are within normal limits. Both lungs are clear. The visualized skeletal structures are unremarkable. IMPRESSION: No active cardiopulmonary disease. Electronically Signed   By: Helyn Numbers MD   On: 10/14/2020 19:21    Procedures .Marland KitchenIncision and Drainage  Date/Time: 10/15/2020 5:29 AM Performed by: Gailen Shelter, PA Authorized by: Gailen Shelter, PA   Consent:    Consent obtained:  Verbal   Consent given by:  Patient   Risks discussed:  Bleeding, incomplete drainage, pain and damage to other organs   Alternatives discussed:  No treatment Universal protocol:    Procedure  explained and questions answered to patient or proxy's satisfaction: yes     Relevant documents present and verified: yes     Test results available and properly labeled: yes     Imaging studies available: yes     Required blood products, implants, devices, and special equipment available: yes     Site/side marked: yes     Immediately prior to procedure a time out was called: yes     Patient identity confirmed:  Verbally with patient Location:    Type:  Abscess   Size:  5x5 cm Pre-procedure details:    Skin preparation:  Betadine Anesthesia (see MAR for exact dosages):    Anesthesia method:  Local infiltration   Local anesthetic:  Bupivacaine 0.5% WITH epi Procedure type:    Complexity:  Complex Procedure details:    Needle aspiration: no     Incision types:  Single straight   Incision depth:  Subcutaneous   Scalpel blade:  11   Wound management:  Probed and deloculated, irrigated with saline and extensive cleaning   Drainage:  Purulent   Drainage amount:  Copious   Wound treatment:  Wound left open Post-procedure details:    Patient tolerance of procedure:  Tolerated well, no immediate complications  .Critical Care Performed by: Gailen Shelter, PA Authorized by: Gailen Shelter, PA   Critical care provider statement:    Critical care time (minutes):  35   Critical care time was exclusive of:  Separately billable procedures and treating other patients and teaching time   Critical care was necessary to treat or prevent imminent  or life-threatening deterioration of the following conditions: respiratory failure 2/2 severe asthma.   Critical care was time spent personally by me on the following activities:  Discussions with consultants, evaluation of patient's response to treatment, examination of patient, review of old charts, re-evaluation of patient's condition, pulse oximetry, ordering and review of radiographic studies, ordering and review of laboratory studies and ordering  and performing treatments and interventions   I assumed direction of critical care for this patient from another provider in my specialty: no     (including critical care time) .procdoc Medications Ordered in ED Medications  albuterol (PROVENTIL) (2.5 MG/3ML) 0.083% nebulizer solution 5 mg (5 mg Nebulization Given 10/14/20 2226)  acetaminophen (TYLENOL) tablet 650 mg (650 mg Oral Given 10/15/20 0214)  bupivacaine (MARCAINE) 0.5 % injection 10 mL (10 mLs Infiltration Given by Other 10/15/20 0250)  sodium chloride 0.9 % bolus 1,000 mL (0 mLs Intravenous Stopped 10/15/20 0459)  ipratropium-albuterol (DUONEB) 0.5-2.5 (3) MG/3ML nebulizer solution 3 mL (3 mLs Nebulization Given 10/15/20 0214)  methylPREDNISolone sodium succinate (SOLU-MEDROL) 125 mg/2 mL injection 125 mg (125 mg Intravenous Given 10/15/20 0205)  magnesium sulfate IVPB 2 g 50 mL (0 g Intravenous Stopped 10/15/20 0327)  albuterol (VENTOLIN HFA) 108 (90 Base) MCG/ACT inhaler 6 puff (6 puffs Inhalation Given 10/15/20 0404)  ipratropium (ATROVENT HFA) inhaler 2 puff (2 puffs Inhalation Given 10/15/20 0425)  AeroChamber Plus Flo-Vu Large MISC (  Given 10/15/20 0404)    ED Course  I have reviewed the triage vital signs and the nursing notes.  Pertinent labs & imaging results that were available during my care of the patient were reviewed by me and considered in my medical decision making (see chart for details).    MDM Rules/Calculators/A&P                          Patient is 39 year old female presented today with history of asthma has been admitted for status asthmaticus multiple times in the past.  Has since stop smoking has not been admitted for the past year however she states that she has been increasingly short of breath over the past week with worsening breathing not improved with her albuterol nebulized treatments.  She also states she has had a abscess to the left breast which is just recently.  Over the past 2 days.  Physical  exam is notable for extremely coarse lung sounds with expiratory wheezing patient hypoxic and placed on 2 L nasal cannula. Physical exam is also notable for significant tachypnea and tachycardia with borderline low O2 measurements.  This correlates with her significantly abnormal lung sounds.  We will initiate empiric treatment for asthma exacerbation.  Magnesium level within normal limits.  BMP with mildly low bicarb glucose mildly elevated creatinine mildly elevated but functionally at baseline.  CBC with mild leukocytosis her blood work also is consistent with significant dehydration and hemoconcentration --her baseline hemoglobin is 12-13 and is almost 16 today. Her tachycardia has significantly improved with 1 L normal saline. Troponin x1 within normal limits.  I-STAT hCG negative for pregnancy. Covid test pending at this time.  Patient has received 2 duo nebs, albuterol MDI, ipratropium MDI, methylprednisolone 125, magnesium 2 g and seems to have continued wheezing and coarse lung sounds and has actually been hypoxic at rest to approximately 87%.  She is satting well on 2 L nasal cannula however at this point will need admission for respiratory failure secondary to asthma exacerbation with status asthmaticus.  Very minimal improvement in patient's breathing.  She is stable however continues to require some supplemental oxygen and have significant wheezing.  Her abscess was incised and drained.  This was done after shared decision making her physician with patient and she was agreeable to having incision made in status is full needle aspiration.  A small incision was made for cosmesis.  Good loculation and irrigation was achieved however.    I will admit to hospitalist.  Will likely benefit from continued breathing treatments and observation as she is hypoxic and continues to have coarse lung sounds of wheezing.  Discussed with Dr. Toniann Fail --he will accept patient to hospital for severe  asthma exacerbation.  Final Clinical Impression(s) / ED Diagnoses Final diagnoses:  Severe asthma with exacerbation, unspecified whether persistent  Abscess  Chest pain, unspecified type    Rx / DC Orders ED Discharge Orders    None       Gailen Shelter, [place] 10/15/20 7510    Nira Conn, MD 10/17/20 [phone]  [facility] FOR APPROPRIATE CRITICAL CARE DOCUMENTATION    Gailen Shelter, PA 10/23/20 0109    Nira Conn, MD 10/24/20 2311

## 2020-10-15 NOTE — ED Notes (Signed)
Pt has a large abscess on the left inner breast. Pt states the breast pain is a 10/10. Pt also states she has chest pain but it's more so when she takes a really deep breath or turns the wrong way. Pt states that pain is a 7/10.  No drainage noted from abscess.

## 2020-10-15 NOTE — Progress Notes (Signed)
Pt arrived on the unit A&Ox4 was able to transfer  (walk) from stretcher to bed with stand by assist. Belongings at bedside are purse, cell phone charger, clothing.

## 2020-10-16 ENCOUNTER — Encounter (HOSPITAL_COMMUNITY): Payer: Self-pay | Admitting: Internal Medicine

## 2020-10-16 DIAGNOSIS — J45901 Unspecified asthma with (acute) exacerbation: Principal | ICD-10-CM

## 2020-10-16 LAB — CBC
HCT: 44.5 % (ref 36.0–46.0)
Hemoglobin: 14.4 g/dL (ref 12.0–15.0)
MCH: 28.5 pg (ref 26.0–34.0)
MCHC: 32.4 g/dL (ref 30.0–36.0)
MCV: 87.9 fL (ref 80.0–100.0)
Platelets: 349 10*3/uL (ref 150–400)
RBC: 5.06 MIL/uL (ref 3.87–5.11)
RDW: 15.7 % — ABNORMAL HIGH (ref 11.5–15.5)
WBC: 17.1 10*3/uL — ABNORMAL HIGH (ref 4.0–10.5)
nRBC: 0 % (ref 0.0–0.2)

## 2020-10-16 LAB — BASIC METABOLIC PANEL
Anion gap: 10 (ref 5–15)
BUN: 12 mg/dL (ref 6–20)
CO2: 21 mmol/L — ABNORMAL LOW (ref 22–32)
Calcium: 9.2 mg/dL (ref 8.9–10.3)
Chloride: 104 mmol/L (ref 98–111)
Creatinine, Ser: 0.93 mg/dL (ref 0.44–1.00)
GFR, Estimated: >60 mL/min (ref >60)
Glucose, Bld: 156 mg/dL — ABNORMAL HIGH (ref 70–99)
Potassium: 4.3 mmol/L (ref 3.5–5.1)
Sodium: 135 mmol/L (ref 135–145)

## 2020-10-16 MED ORDER — ENSURE ENLIVE PO LIQD: 237.0000 mL | Freq: Two times a day (BID) | ORAL | Status: DC

## 2020-10-16 MED ORDER — PREDNISONE 20 MG PO TABS: 40.0000 mg | ORAL_TABLET | Freq: Every day | ORAL | 0 refills | Status: AC

## 2020-10-16 MED ORDER — DOXYCYCLINE HYCLATE 100 MG PO TBEC: 100.0000 mg | DELAYED_RELEASE_TABLET | Freq: Two times a day (BID) | ORAL | 0 refills | Status: AC

## 2020-10-16 MED ORDER — ALBUTEROL SULFATE HFA 108 (90 BASE) MCG/ACT IN AERS: 2.0000 | INHALATION_SPRAY | Freq: Four times a day (QID) | RESPIRATORY_TRACT | 2 refills | Status: DC | PRN

## 2020-10-16 MED ORDER — MONTELUKAST SODIUM 10 MG PO TABS: 10.0000 mg | ORAL_TABLET | Freq: Every day | ORAL | 1 refills | Status: DC

## 2020-10-16 MED ORDER — FLUTICASONE-SALMETEROL 100-50 MCG/DOSE IN AEPB: 1.0000 | INHALATION_SPRAY | Freq: Two times a day (BID) | RESPIRATORY_TRACT | 2 refills | Status: DC

## 2020-10-16 NOTE — Discharge Summary (Signed)
Physician Discharge Summary  Rebekah Evans GHW:[id number] DOB: Sep 06, 1981 DOA: 10/14/2020  PCP: Patient, No Pcp Per  Admit date: 10/14/2020 Discharge date: 10/16/2020  Admitted From: Home Disposition:  Home  Discharge Condition:Stable CODE STATUS:FULL Diet recommendation:  Regular  Brief/Interim Summary: HPI: Rebekah Evans is a 39 y.o. female with medical history significant for morbid obesity, asthma who presents to the ER for evaluation of chest pain as well as painful swelling involving the inner left breast.  She describes the chest pain as a pleuritic pain which is worse with any form of movement or when she takes a deep breath.  She denies having any fever or chills.  Pleuritic chest pain is associated with shortness of breath, a dry cough and wheezing.  She has has had nebulizer at home without any improvement in her symptoms. She is unvaccinated against Covid and denies having any sick contacts.  She has never been intubated for an acute asthma exacerbation.  Since She complains of pain involving her left breast which she rates 10 x 10 in intensity at its worst.  Pain and swelling has progressively worsened over the last 2 days. In the emergency room patient was noted to be tachypneic with respiratory rate of 2600 and pulse oximetry between 90 and 92%.  Patient was able to speak in full sentences. She was also noted to have a 5 x 5 cm fluctuant abscess to the left midline side of the breast which was drained in the emergency room.  Hospital Course:  Her hospital course remained stable.  This morning she was on room air, free of wheezes.  As per her history, she might have mild to moderate persistent asthma as she was having frequent exacerbations recently.  She denies smoking.  She does not complain of any pain in her left breast today.  She is medically stable for discharge to home today with oral antibiotics, oral steroids.  She has also been prescribed Advair inhaler, Singulair for  asthma.  I have recommended her to follow-up with the PCP in 1 to 2 weeks.  Following problems were addressed during hospitalization:   Acute asthma exacerbation Report of frequent attacks recently.  Started on oral steroids, rescue inhaler, Advair and Singulair.   Left breast abscess S/p incision and drainage Continue daily dressing at home for next few days Take doxycycline   Obesity (BMI 36)   Discharge Diagnoses:  Active Problems:   Acute asthma exacerbation   Left breast abscess   Obesity (BMI 30-39.9)    Discharge Instructions  Discharge Instructions    Diet general   Complete by: As directed    Discharge instructions   Complete by: As directed    1)Please take prescribed medications as instructed. 2)Follow up with a PCP in 1 to 2 weeks.  Do a CBC test during the follow-up.   Increase activity slowly   Complete by: As directed    No wound care   Complete by: As directed      Allergies as of 10/16/2020   No Known Allergies     Medication List    STOP taking these medications   benzonatate 100 MG capsule Commonly known as: TESSALON   folic acid 1 MG tablet Commonly known as: FOLVITE     TAKE these medications   albuterol 108 (90 Base) MCG/ACT inhaler Commonly known as: VENTOLIN HFA Inhale 2 puffs into the lungs every 6 (six) hours as needed for wheezing or shortness of breath.   doxycycline 100  MG EC tablet Commonly known as: DORYX Take 1 tablet (100 mg total) by mouth 2 (two) times daily for 5 days.   Fluticasone-Salmeterol 100-50 MCG/DOSE Aepb Commonly known as: Advair Diskus Inhale 1 puff into the lungs in the morning and at bedtime.   ipratropium-albuterol 0.5-2.5 (3) MG/3ML Soln Commonly known as: DUONEB Take 3 mLs by nebulization every 6 (six) hours as needed.   montelukast 10 MG tablet Commonly known as: Singulair Take 1 tablet (10 mg total) by mouth at bedtime.   multivitamin with minerals Tabs tablet Take 1 tablet by mouth  daily.   predniSONE 20 MG tablet Commonly known as: Deltasone Take 2 tablets (40 mg total) by mouth daily for 4 days. Start taking on: October 17, 2020 What changed:   how much to take  how to take this  when to take this  additional instructions       No Known Allergies  Consultations:  None   Procedures/Studies: DG Chest 2 View  Result Date: 10/14/2020 CLINICAL DATA:  Chest pain EXAM: CHEST - 2 VIEW COMPARISON:  03/15/2020 FINDINGS: The heart size and mediastinal contours are within normal limits. Both lungs are clear. The visualized skeletal structures are unremarkable. IMPRESSION: No active cardiopulmonary disease. Electronically Signed   By: Helyn NumbersAshesh  Parikh MD   On: 10/14/2020 19:21      Subjective: Patient seen and examined at the bedside this morning.  Medically stable for discharge today.  Discharge Exam: Vitals:   10/16/20 0705 10/16/20 0832  BP:    Pulse:  88  Resp: 20 18  Temp:    SpO2:  93%   Vitals:   10/16/20 0228 10/16/20 0553 10/16/20 0705 10/16/20 0832  BP:  131/85    Pulse:  95  88  Resp:  20 20 18   Temp:  97.8 F (36.6 C)    TempSrc:  Oral    SpO2: 97% 93%  93%  Weight:      Height:        General: Pt is alert, awake, not in acute distress Cardiovascular: RRR, S1/S2 +, no rubs, no gallops Respiratory: CTA bilaterally, no wheezing, no rhonchi Abdominal: Soft, NT, ND, bowel sounds + Extremities: no edema, no cyanosis    The results of significant diagnostics from this hospitalization (including imaging, microbiology, ancillary and laboratory) are listed below for reference.     Microbiology: Recent Results (from the past 240 hour(s))  Respiratory Panel by RT PCR (Flu A&B, Covid) - Nasopharyngeal Swab     Status: None   Collection Time: 10/15/20  4:08 AM   Specimen: Nasopharyngeal Swab  Result Value Ref Range Status   SARS Coronavirus 2 by RT PCR NEGATIVE NEGATIVE Final    Comment: (NOTE) SARS-CoV-2 target nucleic acids are  NOT DETECTED.  The SARS-CoV-2 RNA is generally detectable in upper respiratoy specimens during the acute phase of infection. The lowest concentration of SARS-CoV-2 viral copies this assay can detect is 131 copies/mL. A negative result does not preclude SARS-Cov-2 infection and should not be used as the sole basis for treatment or other patient management decisions. A negative result may occur with  improper specimen collection/handling, submission of specimen other than nasopharyngeal swab, presence of viral mutation(s) within the areas targeted by this assay, and inadequate number of viral copies (<131 copies/mL). A negative result must be combined with clinical observations, patient history, and epidemiological information. The expected result is Negative.  Fact Sheet for Patients:  https://www.moore.com/https://www.fda.gov/media/142436/download  Fact Sheet for Healthcare Providers:  https://www.young.biz/  This test is no t yet approved or cleared by the [place] and  has been authorized for detection and/or diagnosis of SARS-CoV-2 by FDA under an Emergency Use Authorization (EUA). This EUA will remain  in effect (meaning this test can be used) for the duration of the COVID-19 declaration under Section 564(b)(1) of the Act, 21 U.S.C. section 360bbb-3(b)(1), unless the authorization is terminated or revoked sooner.     Influenza A by PCR NEGATIVE NEGATIVE Final   Influenza B by PCR NEGATIVE NEGATIVE Final    Comment: (NOTE) The Xpert Xpress SARS-CoV-2/FLU/RSV assay is intended as an aid in  the diagnosis of influenza from Nasopharyngeal swab specimens and  should not be used as a sole basis for treatment. Nasal washings and  aspirates are unacceptable for Xpert Xpress SARS-CoV-2/FLU/RSV  testing.  Fact Sheet for Patients: https://www.moore.com/  Fact Sheet for Healthcare Providers: https://www.young.biz/  This test is not yet  approved or cleared by the [place] FDA and  has been authorized for detection and/or diagnosis of SARS-CoV-2 by  FDA under an Emergency Use Authorization (EUA). This EUA will remain  in effect (meaning this test can be used) for the duration of the  Covid-19 declaration under Section 564(b)(1) of the Act, 21  U.S.C. section 360bbb-3(b)(1), unless the authorization is  terminated or revoked. Performed at [facility], [address] [place], [place] [address]      Labs: BNP (last 3 results) No results for input(s): BNP in the last 8760 hours. Basic Metabolic Panel: Recent Labs  Lab 10/15/20 0411 10/16/20 0022  NA 135 135  K 4.1 4.3  CL 104 104  CO2 21* 21*  GLUCOSE 161* 156*  BUN 9 12  CREATININE 1.05* 0.93  CALCIUM 8.6* 9.2  MG 2.4  --    Liver Function Tests: No results for input(s): AST, ALT, ALKPHOS, BILITOT, PROT, ALBUMIN in the last 168 hours. No results for input(s): LIPASE, AMYLASE in the last 168 hours. No results for input(s): AMMONIA in the last 168 hours. CBC: Recent Labs  Lab 10/14/20 1847 10/16/20 0022  WBC 14.0* 17.1*  HGB 15.7* 14.4  HCT 50.7* 44.5  MCV 90.9 87.9  PLT 325 349   Cardiac Enzymes: No results for input(s): CKTOTAL, CKMB, CKMBINDEX, TROPONINI in the last 168 hours. BNP: Invalid input(s): POCBNP CBG: No results for input(s): GLUCAP in the last 168 hours. D-Dimer No results for input(s): DDIMER in the last 72 hours. Hgb A1c No results for input(s): HGBA1C in the last 72 hours. Lipid Profile No results for input(s): CHOL, HDL, LDLCALC, TRIG, CHOLHDL, LDLDIRECT in the last 72 hours. Thyroid function studies No results for input(s): TSH, T4TOTAL, T3FREE, THYROIDAB in the last 72 hours.  Invalid input(s): FREET3 Anemia work up No results for input(s): VITAMINB12, FOLATE, FERRITIN, TIBC, IRON, RETICCTPCT in the last 72 hours. Urinalysis    Component Value Date/Time   COLORURINE YELLOW 10/07/2016 1455   APPEARANCEUR  HAZY (A) 10/07/2016 1455   LABSPEC 1.020 10/07/2016 1455   PHURINE 6.0 10/07/2016 1455   GLUCOSEU NEGATIVE 10/07/2016 1455   HGBUR LARGE (A) 10/07/2016 1455   BILIRUBINUR NEGATIVE 10/07/2016 1455   KETONESUR NEGATIVE 10/07/2016 1455   PROTEINUR NEGATIVE 10/07/2016 1455   NITRITE NEGATIVE 10/07/2016 1455   LEUKOCYTESUR NEGATIVE 10/07/2016 1455   Sepsis Labs Invalid input(s): PROCALCITONIN,  WBC,  LACTICIDVEN Microbiology Recent Results (from the past 240 hour(s))  Respiratory Panel by RT PCR (Flu A&B, Covid) - Nasopharyngeal Swab  Status: None   Collection Time: 10/15/20  4:08 AM   Specimen: Nasopharyngeal Swab  Result Value Ref Range Status   SARS Coronavirus 2 by RT PCR NEGATIVE NEGATIVE Final    Comment: (NOTE) SARS-CoV-2 target nucleic acids are NOT DETECTED.  The SARS-CoV-2 RNA is generally detectable in upper respiratoy specimens during the acute phase of infection. The lowest concentration of SARS-CoV-2 viral copies this assay can detect is 131 copies/mL. A negative result does not preclude SARS-Cov-2 infection and should not be used as the sole basis for treatment or other patient management decisions. A negative result may occur with  improper specimen collection/handling, submission of specimen other than nasopharyngeal swab, presence of viral mutation(s) within the areas targeted by this assay, and inadequate number of viral copies (<131 copies/mL). A negative result must be combined with clinical observations, patient history, and epidemiological information. The expected result is Negative.  Fact Sheet for Patients:  https://www.moore.com/  Fact Sheet for Healthcare Providers:  https://www.young.biz/  This test is no t yet approved or cleared by the [place] FDA and  has been authorized for detection and/or diagnosis of SARS-CoV-2 by FDA under an Emergency Use Authorization (EUA). This EUA will remain  in effect  (meaning this test can be used) for the duration of the COVID-19 declaration under Section 564(b)(1) of the Act, 21 U.S.C. section 360bbb-3(b)(1), unless the authorization is terminated or revoked sooner.     Influenza A by PCR NEGATIVE NEGATIVE Final   Influenza B by PCR NEGATIVE NEGATIVE Final    Comment: (NOTE) The Xpert Xpress SARS-CoV-2/FLU/RSV assay is intended as an aid in  the diagnosis of influenza from Nasopharyngeal swab specimens and  should not be used as a sole basis for treatment. Nasal washings and  aspirates are unacceptable for Xpert Xpress SARS-CoV-2/FLU/RSV  testing.  Fact Sheet for Patients: https://www.moore.com/  Fact Sheet for Healthcare Providers: https://www.young.biz/  This test is not yet approved or cleared by the [place] FDA and  has been authorized for detection and/or diagnosis of SARS-CoV-2 by  FDA under an Emergency Use Authorization (EUA). This EUA will remain  in effect (meaning this test can be used) for the duration of the  Covid-19 declaration under Section 564(b)(1) of the Act, 21  U.S.C. section 360bbb-3(b)(1), unless the authorization is  terminated or revoked. Performed at [facility], [address] [place], [place] [address]     Please note: You were cared for by a hospitalist during your hospital stay. Once you are discharged, your primary care physician will handle any further medical issues. Please note that NO REFILLS for any discharge medications will be authorized once you are discharged, as it is imperative that you return to your primary care physician (or establish a relationship with a primary care physician if you do not have one) for your post hospital discharge needs so that they can reassess your need for medications and monitor your lab values.    Time coordinating discharge: 40 minutes  SIGNED:   Burnadette Pop, MD  [facility] 10/16/2020, 11:37 AM Pager  [phone]  If 7PM-7AM, please contact night-coverage [email or web address] Password [email or web address]

## 2020-10-16 NOTE — Progress Notes (Signed)
Received patient for care around 1900 from off-going nurse. MAR, chart, and labs reviewed. Patient alert and oriented x4. Patient maintained oxygen saturation above 92% on room air. Skin assessed with no new skin issues noted. Small amounts of serous drainage noted to left breast abscess incision site. Sterile Dry gauze used to clean site and keep dry. Patient turned and repositioned every 2 hours and as needed with no assist. Patient continent x2. Vitals stable this shift. Call light in reach, bed in low position, wheels locked. No acute events this shift. Will continue to monitor for signs and symptoms of deterioration and report as needed.

## 2020-10-16 NOTE — Progress Notes (Signed)
Initial Nutrition Assessment  DOCUMENTATION CODES:   Obesity unspecified  INTERVENTION:   Please obtain new measured weight  Recommend liberalizing diet to regular  Ensure Enlive po BID, each supplement provides 350 kcal and 20 grams of protein  NUTRITION DIAGNOSIS:   Inadequate oral intake related to decreased appetite as evidenced by per patient/family report.    GOAL:   Patient will meet greater than or equal to 90% of their needs    MONITOR:   PO intake, Supplement acceptance, I & O's, Labs, Weight trends  REASON FOR ASSESSMENT:   Consult Assessment of nutrition requirement/status  ASSESSMENT:   Pt with PMH significant for asthma admitted for acute asthma exacerbation and L breast abscess s/p incision and drainage.    MD in room at time of visit. Pt reports that at baseline, she eats 3 balanced meals per day and doesn't struggle with her appetite. However, over the last couple of weeks, she has been noticing that her food doesn't taste right and has therefore had decreased intake. Pt attributes this to increased use of her nebulizer. Since being admitted, pt has had limited intake as she really dislikes the food. Recommend liberalizing pt's diet to regular to encourage adequate po intake. Will also provide pt with oral nutrition supplements to help her meet her kcal/protein needs.   No PO intake documented.  Reviewed wt history. No significant wt changes noted based on available wts; however, suspect current wt (106.6 kg) was reported/estimated. Recommend obtaining new measured wt, though pt denies any significant wt changes.   No UOP documented.  Labs reviewed. Medications: deltasone   Diet Order:   Diet Order            Diet 2 gram sodium Room service appropriate? Yes; Fluid consistency: Thin  Diet effective now                 EDUCATION NEEDS:   No education needs have been identified at this time  Skin:  Skin Assessment: Skin Integrity  Issues: Skin Integrity Issues:: Incisions Incisions: L breast  Last BM:  10/14/20  Height:   Ht Readings from Last 1 Encounters:  10/15/20 5\' 7"  (1.702 m)    Weight:   Wt Readings from Last 1 Encounters:  10/15/20 106.6 kg    Ideal Body Weight:  61.36 kg  BMI:  Body mass index is 36.81 kg/m.  Estimated Nutritional Needs:   Kcal:  13/03/21  Protein:  115-130 grams  Fluid:  >2L    [phone], MS, RD, LDN RD pager number and weekend/on-call pager number located in Amion.

## 2020-10-16 NOTE — Progress Notes (Signed)
Physical Therapy Treatment/ Discharge Patient Details Name: Rebekah Evans MRN: [id number] DOB: Apr 29, 1981 Today's Date: 10/16/2020    History of Present Illness Rebekah Evans is a 39 y.o. female with medical history significant for morbid obesity, asthma who presents to the ER for evaluation of chest pain as well as painful swelling involving the inner left breast.  She describes the chest pain as a pleuritic pain which is worse with any form of movement or when she takes a deep breath.  She denies having any fever or chills.  Pleuritic chest pain is associated with shortness of breath, a dry cough and wheezing.  She has has had nebulizer at home without any improvement in her symptoms.She complains of pain involving her left breast which she rates 10 x 10 in intensity at its worst.  Pain and swelling has progressively worsened over the last 2 days.    PT Comments    Pt presents to PT with increased endurance and less fatigue with functional mobility. On arrival, pt was on room air and her SpO2 was 96%. Pt walked to stairs and after climbing the flight, reported moderate fatigue with an SpO2 of 95%. After pt descended stairs and walked 200 feet, her SpO2 was at 97%. At the end of tx, pts SpO2 was 95% and pt reported minimal fatigue. Pt was told to continue with her energy conservation techniques on discharge Pt has met all goals and is adequate for discharge. Pt is in agreeance with this plan. No further acute therapy needed.    Follow Up Recommendations  No PT follow up     Equipment Recommendations       Recommendations for Other Services       Precautions / Restrictions Precautions Precautions: None Restrictions Weight Bearing Restrictions: No    Mobility  Bed Mobility Overal bed mobility: Independent Bed Mobility: Supine to Sit     Supine to sit: Independent        Transfers Overall transfer level: Independent                  Ambulation/Gait Ambulation/Gait  assistance: Independent Gait Distance (Feet): 600 Feet Assistive device: None Gait Pattern/deviations: WFL(Within Functional Limits)   Gait velocity interpretation: 1.31 - 2.62 ft/sec, indicative of limited community ambulator General Gait Details: Pt was able to ambulate and did not need oxygen to maintain stats above 90%   Stairs Stairs: Yes Stairs assistance: Independent Stair Management: One rail Right;One rail Left Number of Stairs: 22 General stair comments: used RT rail going up and rail on the LT going down   Wheelchair Mobility    Modified Rankin (Stroke Patients Only)       Balance Overall balance assessment: Independent         Standing balance support: No upper extremity supported;During functional activity Standing balance-Leahy Scale: Good                              Cognition Arousal/Alertness: Awake/alert Behavior During Therapy: WFL for tasks assessed/performed Overall Cognitive Status: Within Functional Limits for tasks assessed                                        Exercises      General Comments General comments (skin integrity, edema, etc.): reviewed energy conservation techniques       Pertinent  Vitals/Pain Pain Assessment: No/denies pain    Home Living Family/patient expects to be discharged to:: Private residence Living Arrangements: Children Available Help at Discharge: Family;Available PRN/intermittently;Available 24 hours/day Type of Home: Apartment Home Access: Stairs to enter Entrance Stairs-Rails: Left Home Layout: One level Home Equipment: None      Prior Function Level of Independence: Independent      Comments: not working or driving, enjoy writing and coloring; independnet ADLs    PT Goals (current goals can now be found in the care plan section) Acute Rehab PT Goals Patient Stated Goal: to go home PT Goal Formulation: With patient Time For Goal Achievement: 10/29/20 Potential to  Achieve Goals: Good Progress towards PT goals: Progressing toward goals    Frequency    Min 3X/week      PT Plan Current plan remains appropriate    Co-evaluation              AM-PAC PT "6 Clicks" Mobility   Outcome Measure  Help needed turning from your back to your side while in a flat bed without using bedrails?: None Help needed moving from lying on your back to sitting on the side of a flat bed without using bedrails?: None Help needed moving to and from a bed to a chair (including a wheelchair)?: None Help needed standing up from a chair using your arms (e.g., wheelchair or bedside chair)?: None Help needed to walk in hospital room?: None Help needed climbing 3-5 steps with a railing? : None 6 Click Score: 24    End of Session   Activity Tolerance: Patient tolerated treatment well Patient left: in chair;with call bell/phone within reach   PT Visit Diagnosis: Muscle weakness (generalized) (M62.81);Other abnormalities of gait and mobility (R26.89) (gait/mobility limited from breathing difficulties)     Time: 2956-2130 PT Time Calculation (min) (ACUTE ONLY): 10 min  Charges:  $Gait Training: 8-22 mins                     [place], SPT [id number]   Rebekah Evans 10/16/2020, 11:06 AM

## 2020-10-16 NOTE — Evaluation (Signed)
Occupational Therapy Evaluation Patient Details Name: Rebekah Evans MRN: [id number] DOB: [birth year] Today's Date: 10/16/2020    History of Present Illness Rebekah Evans is a 39 y.o. female with medical history significant for morbid obesity, asthma who presents to the ER for evaluation of chest pain as well as painful swelling involving the inner left breast.  She describes the chest pain as a pleuritic pain which is worse with any form of movement or when she takes a deep breath.  She denies having any fever or chills.  Pleuritic chest pain is associated with shortness of breath, a dry cough and wheezing.  She has has had nebulizer at home without any improvement in her symptoms.She complains of pain involving her left breast which she rates 10 x 10 in intensity at its worst.  Pain and swelling has progressively worsened over the last 2 days.   Clinical Impression   PTA patient independent, not working or driving. Admitted for above and presenting near baseline modified independent level for ADLs, mobility and transfers.  Pt on RA during session with SpO2 maintained >94%, but notable wheezing during mobilization.  Reviewed energy conservation techniques and pursed lip breathing.  Recommend 3:1 for shower chair at dc.  Based on performance today, no further OT services indicated at this time.  OT will sign off.      Follow Up Recommendations  No OT follow up    Equipment Recommendations  3 in 1 bedside commode (to use as shower chair)    Recommendations for Other Services       Precautions / Restrictions Precautions Precautions: None Restrictions Weight Bearing Restrictions: No      Mobility Bed Mobility Overal bed mobility: Independent                  Transfers Overall transfer level: Independent                    Balance Overall balance assessment: No apparent balance deficits (not formally assessed)                                          ADL either performed or assessed with clinical judgement   ADL Overall ADL's : Modified independent                                       General ADL Comments: pt modified independent for ADls, mobility and transfers in room. Reviewed energy conservation techniques, SpO2 maintained >94% on RA but notable wheezing.      Vision         Perception     Praxis      Pertinent Vitals/Pain Pain Assessment: No/denies pain     Hand Dominance Right   Extremity/Trunk Assessment Upper Extremity Assessment Upper Extremity Assessment: Overall WFL for tasks assessed   Lower Extremity Assessment Lower Extremity Assessment: Defer to PT evaluation   Cervical / Trunk Assessment Cervical / Trunk Assessment: Normal   Communication Communication Communication: No difficulties   Cognition Arousal/Alertness: Awake/alert Behavior During Therapy: WFL for tasks assessed/performed Overall Cognitive Status: Within Functional Limits for tasks assessed  General Comments  reviewed energy conservation techniques     Exercises     Shoulder Instructions      Home Living Family/patient expects to be discharged to:: Private residence Living Arrangements: Children Available Help at Discharge: Family;Available PRN/intermittently;Available 24 hours/day Type of Home: Apartment Home Access: Stairs to enter Entrance Stairs-Number of Steps: flight Entrance Stairs-Rails: Left Home Layout: One level     Bathroom Shower/Tub: Chief Strategy Officer: Standard     Home Equipment: None          Prior Functioning/Environment Level of Independence: Independent        Comments: not working or driving, enjoy writing and coloring; independnet ADLs         OT Problem List: Decreased activity tolerance;Cardiopulmonary status limiting activity;Obesity      OT Treatment/Interventions:      OT Goals(Current goals  can be found in the care plan section) Acute Rehab OT Goals Patient Stated Goal: to go home OT Goal Formulation: With patient  OT Frequency:     Barriers to D/C:            Co-evaluation              AM-PAC OT "6 Clicks" Daily Activity     Outcome Measure Help from another person eating meals?: None Help from another person taking care of personal grooming?: None Help from another person toileting, which includes using toliet, bedpan, or urinal?: None Help from another person bathing (including washing, rinsing, drying)?: None Help from another person to put on and taking off regular upper body clothing?: None Help from another person to put on and taking off regular lower body clothing?: None 6 Click Score: 24   End of Session Nurse Communication: Mobility status  Activity Tolerance: Patient tolerated treatment well Patient left: in bed;with call bell/phone within reach  OT Visit Diagnosis: Muscle weakness (generalized) (M62.81);Other (comment) (decreased activity tolerance)                Time: 5361-4431 OT Time Calculation (min): 13 min Charges:  OT General Charges $OT Visit: 1 Visit OT Evaluation $OT Eval Low Complexity: 1 Low  Barry Brunner, OT Acute Rehabilitation Services Pager [phone] Office [phone]   Chancy Milroy 10/16/2020, 10:06 AM

## 2020-11-14 ENCOUNTER — Other Ambulatory Visit: Payer: Self-pay

## 2020-11-14 ENCOUNTER — Emergency Department (HOSPITAL_COMMUNITY): Payer: Self-pay

## 2020-11-14 ENCOUNTER — Emergency Department (HOSPITAL_COMMUNITY)
Admission: EM | Admit: 2020-11-14 | Discharge: 2020-11-14 | Disposition: A | Discharge status: Home / Self Care | Payer: Self-pay | Attending: Emergency Medicine | Admitting: Emergency Medicine

## 2020-11-14 ENCOUNTER — Encounter (HOSPITAL_COMMUNITY): Payer: Self-pay

## 2020-11-14 DIAGNOSIS — J45901 Unspecified asthma with (acute) exacerbation: Secondary | ICD-10-CM | POA: Insufficient documentation

## 2020-11-14 DIAGNOSIS — Z5321 Procedure and treatment not carried out due to patient leaving prior to being seen by health care provider: Secondary | ICD-10-CM | POA: Insufficient documentation
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
## 2020-12-03 ENCOUNTER — Encounter (HOSPITAL_COMMUNITY): Payer: Self-pay

## 2021-11-08 ENCOUNTER — Emergency Department (HOSPITAL_COMMUNITY): Payer: No Typology Code available for payment source

## 2021-11-08 ENCOUNTER — Other Ambulatory Visit: Payer: Self-pay

## 2021-11-08 ENCOUNTER — Emergency Department (HOSPITAL_COMMUNITY)
Admission: EM | Admit: 2021-11-08 | Discharge: 2021-11-08 | Disposition: A | Discharge status: Home / Self Care | Payer: No Typology Code available for payment source | Attending: Emergency Medicine | Admitting: Emergency Medicine

## 2021-11-08 ENCOUNTER — Encounter (HOSPITAL_COMMUNITY): Payer: Self-pay

## 2021-11-08 DIAGNOSIS — S060X0A Concussion without loss of consciousness, initial encounter: Secondary | ICD-10-CM | POA: Diagnosis not present

## 2021-11-08 DIAGNOSIS — Y92410 Unspecified street and highway as the place of occurrence of the external cause: Secondary | ICD-10-CM | POA: Diagnosis not present

## 2021-11-08 DIAGNOSIS — J45909 Unspecified asthma, uncomplicated: Secondary | ICD-10-CM | POA: Insufficient documentation

## 2021-11-08 DIAGNOSIS — N9489 Other specified conditions associated with female genital organs and menstrual cycle: Secondary | ICD-10-CM | POA: Insufficient documentation

## 2021-11-08 DIAGNOSIS — S199XXA Unspecified injury of neck, initial encounter: Secondary | ICD-10-CM | POA: Insufficient documentation

## 2021-11-08 DIAGNOSIS — S8992XA Unspecified injury of left lower leg, initial encounter: Secondary | ICD-10-CM | POA: Insufficient documentation

## 2021-11-08 DIAGNOSIS — Z87891 Personal history of nicotine dependence: Secondary | ICD-10-CM | POA: Insufficient documentation

## 2021-11-08 DIAGNOSIS — S0990XA Unspecified injury of head, initial encounter: Secondary | ICD-10-CM | POA: Diagnosis present

## 2021-11-08 DIAGNOSIS — M25562 Pain in left knee: Secondary | ICD-10-CM

## 2021-11-08 LAB — I-STAT BETA HCG BLOOD, ED (MC, WL, AP ONLY): I-stat hCG, quantitative: <5 m[IU]/mL (ref <5)

## 2021-11-08 MED ORDER — KETOROLAC TROMETHAMINE 30 MG/ML IJ SOLN
30.0000 mg | Freq: Once | INTRAMUSCULAR | Status: AC
  Administered 2021-11-08: 14:00:00 30 mg via INTRAMUSCULAR
  Filled 2021-11-08: qty 1

## 2021-11-08 MED ORDER — ACETAMINOPHEN 500 MG PO TABS
1000.0000 mg | ORAL_TABLET | Freq: Once | ORAL | Status: AC
  Administered 2021-11-08: 13:00:00 1000 mg via ORAL
  Filled 2021-11-08: qty 2
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
## 2023-06-09 ENCOUNTER — Observation Stay (HOSPITAL_COMMUNITY)
Admission: EM | Admit: 2023-06-09 | Discharge: 2023-06-11 | Disposition: A | Discharge status: Home / Self Care | Payer: 59 | Attending: Internal Medicine | Admitting: Internal Medicine

## 2023-06-09 ENCOUNTER — Encounter (HOSPITAL_COMMUNITY): Payer: Self-pay

## 2023-06-09 ENCOUNTER — Emergency Department (HOSPITAL_COMMUNITY): Payer: 59

## 2023-06-09 ENCOUNTER — Other Ambulatory Visit: Payer: Self-pay

## 2023-06-09 DIAGNOSIS — Z87891 Personal history of nicotine dependence: Secondary | ICD-10-CM | POA: Diagnosis not present

## 2023-06-09 DIAGNOSIS — J4551 Severe persistent asthma with (acute) exacerbation: Secondary | ICD-10-CM

## 2023-06-09 DIAGNOSIS — R0602 Shortness of breath: Secondary | ICD-10-CM | POA: Diagnosis present

## 2023-06-09 DIAGNOSIS — D72819 Decreased white blood cell count, unspecified: Secondary | ICD-10-CM | POA: Insufficient documentation

## 2023-06-09 DIAGNOSIS — J9601 Acute respiratory failure with hypoxia: Secondary | ICD-10-CM | POA: Insufficient documentation

## 2023-06-09 DIAGNOSIS — Z6841 Body Mass Index (BMI) 40.0 and over, adult: Secondary | ICD-10-CM | POA: Diagnosis not present

## 2023-06-09 DIAGNOSIS — R0682 Tachypnea, not elsewhere classified: Secondary | ICD-10-CM | POA: Insufficient documentation

## 2023-06-09 DIAGNOSIS — F1210 Cannabis abuse, uncomplicated: Secondary | ICD-10-CM | POA: Diagnosis present

## 2023-06-09 DIAGNOSIS — D72823 Leukemoid reaction: Secondary | ICD-10-CM | POA: Insufficient documentation

## 2023-06-09 DIAGNOSIS — R059 Cough, unspecified: Secondary | ICD-10-CM | POA: Insufficient documentation

## 2023-06-09 DIAGNOSIS — D72829 Elevated white blood cell count, unspecified: Secondary | ICD-10-CM | POA: Diagnosis not present

## 2023-06-09 DIAGNOSIS — J45901 Unspecified asthma with (acute) exacerbation: Principal | ICD-10-CM | POA: Insufficient documentation

## 2023-06-09 DIAGNOSIS — E876 Hypokalemia: Secondary | ICD-10-CM | POA: Diagnosis not present

## 2023-06-09 DIAGNOSIS — Z720 Tobacco use: Secondary | ICD-10-CM | POA: Diagnosis present

## 2023-06-09 DIAGNOSIS — J4541 Moderate persistent asthma with (acute) exacerbation: Secondary | ICD-10-CM

## 2023-06-09 LAB — CBC WITH DIFFERENTIAL/PLATELET
Abs Immature Granulocytes: 0.09 10*3/uL — ABNORMAL HIGH (ref 0.00–0.07)
Basophils Absolute: 0 10*3/uL (ref 0.0–0.1)
Basophils Relative: 0 %
Eosinophils Absolute: 0 10*3/uL (ref 0.0–0.5)
Eosinophils Relative: 0 %
HCT: 38.3 % (ref 36.0–46.0)
Hemoglobin: 12.1 g/dL (ref 12.0–15.0)
Immature Granulocytes: 0 %
Lymphocytes Relative: 7 %
Lymphs Abs: 1.3 10*3/uL (ref 0.7–4.0)
MCH: 24.3 pg — ABNORMAL LOW (ref 26.0–34.0)
MCHC: 31.6 g/dL (ref 30.0–36.0)
MCV: 76.9 fL — ABNORMAL LOW (ref 80.0–100.0)
Monocytes Absolute: 0.5 10*3/uL (ref 0.1–1.0)
Monocytes Relative: 2 %
Neutro Abs: 18.2 10*3/uL — ABNORMAL HIGH (ref 1.7–7.7)
Neutrophils Relative %: 91 %
Platelets: 363 10*3/uL (ref 150–400)
RBC: 4.98 MIL/uL (ref 3.87–5.11)
RDW: 19.8 % — ABNORMAL HIGH (ref 11.5–15.5)
WBC: 20.1 10*3/uL — ABNORMAL HIGH (ref 4.0–10.5)
nRBC: 0 % (ref 0.0–0.2)

## 2023-06-09 LAB — COMPREHENSIVE METABOLIC PANEL
ALT: 16 U/L (ref 0–44)
AST: 16 U/L (ref 15–41)
Albumin: 4 g/dL (ref 3.5–5.0)
Alkaline Phosphatase: 97 U/L (ref 38–126)
Anion gap: 11 (ref 5–15)
BUN: 10 mg/dL (ref 6–20)
CO2: 22 mmol/L (ref 22–32)
Calcium: 8.5 mg/dL — ABNORMAL LOW (ref 8.9–10.3)
Chloride: 102 mmol/L (ref 98–111)
Creatinine, Ser: 0.94 mg/dL (ref 0.44–1.00)
GFR, Estimated: >60 mL/min (ref >60)
Glucose, Bld: 154 mg/dL — ABNORMAL HIGH (ref 70–99)
Potassium: 3.3 mmol/L — ABNORMAL LOW (ref 3.5–5.1)
Sodium: 135 mmol/L (ref 135–145)
Total Bilirubin: 1.1 mg/dL (ref 0.3–1.2)
Total Protein: 7.9 g/dL (ref 6.5–8.1)

## 2023-06-09 LAB — BLOOD GAS, VENOUS
Acid-Base Excess: 6.3 mmol/L — ABNORMAL HIGH (ref 0.0–2.0)
Bicarbonate: 29.4 mmol/L — ABNORMAL HIGH (ref 20.0–28.0)
Drawn by: 53361
O2 Saturation: 75.8 %
Patient temperature: 37.1
pCO2, Ven: 36 mmHg — ABNORMAL LOW (ref 44–60)
pH, Ven: 7.52 — ABNORMAL HIGH (ref 7.25–7.43)
pO2, Ven: 46 mmHg — ABNORMAL HIGH (ref 32–45)

## 2023-06-09 MED ORDER — IPRATROPIUM-ALBUTEROL 0.5-2.5 (3) MG/3ML IN SOLN
9.0000 mL | Freq: Once | RESPIRATORY_TRACT | Status: AC
  Administered 2023-06-09: 9 mL via RESPIRATORY_TRACT
  Filled 2023-06-09: qty 3

## 2023-06-09 MED ORDER — ALBUTEROL SULFATE HFA 108 (90 BASE) MCG/ACT IN AERS
2.0000 | INHALATION_SPRAY | RESPIRATORY_TRACT | Status: DC | PRN
  Filled 2023-06-09: qty 6.7

## 2023-06-09 MED ORDER — IPRATROPIUM-ALBUTEROL 0.5-2.5 (3) MG/3ML IN SOLN
RESPIRATORY_TRACT | Status: AC
  Filled 2023-06-09: qty 3

## 2023-06-09 MED ORDER — METHYLPREDNISOLONE SODIUM SUCC 125 MG IJ SOLR
125.0000 mg | Freq: Once | INTRAMUSCULAR | Status: AC
  Administered 2023-06-09: 125 mg via INTRAVENOUS
  Filled 2023-06-09: qty 2

## 2023-06-09 MED ORDER — ALBUTEROL SULFATE (2.5 MG/3ML) 0.083% IN NEBU
2.5000 mg | INHALATION_SOLUTION | RESPIRATORY_TRACT | Status: DC
  Administered 2023-06-10: 2.5 mg via RESPIRATORY_TRACT
  Filled 2023-06-09: qty 3

## 2023-06-09 MED ORDER — ALBUTEROL SULFATE (2.5 MG/3ML) 0.083% IN NEBU
10.0000 mg | INHALATION_SOLUTION | Freq: Once | RESPIRATORY_TRACT | Status: AC
  Administered 2023-06-09: 10 mg via RESPIRATORY_TRACT

## 2023-06-09 MED ORDER — ALBUTEROL SULFATE (2.5 MG/3ML) 0.083% IN NEBU
INHALATION_SOLUTION | RESPIRATORY_TRACT | Status: AC
  Filled 2023-06-09: qty 12

## 2023-06-09 NOTE — H&P (Signed)
History and Physical    Patient: Rebekah Evans:[id number] DOB: 04-15-1981 DOA: 06/09/2023 DOS: the patient was seen and examined on 06/10/2023 PCP: Patient, No Pcp Per  Patient coming from: Home  Chief Complaint:  Chief Complaint  Patient presents with   Shortness of Breath   HPI: Rebekah Evans is a 42 y.o. female with medical history significant of asthma, morbid obesity who presents to the emergency department due to 1 day onset of shortness of breath.  Patient complained of shortness of breath progressively worsening since yesterday in the evening, shortness of breath worsened despite use of home albuterol inhaler, so she presents to the ED for further evaluation and management. Patient states that she has had previous hospitalization for asthma exacerbation in the past, she denies chest pain, fever, chills, abdominal pain, nausea or vomiting.  ED Course:  In the emergency department, patient was tachypneic, tachycardic, BP was 122/102, O2 sat was 88% on room air, but this improved to 91-93% on supplemental oxygen via [place] at 2.5 LPM.  Workup in the ED showed normal CBC except WBC of 20.1, MCV 76.9.  BMP was normal except for potassium of 3.3 and blood glucose of 154. Chest x-ray showed increased interstitial lung markings which may represent sequela associated with mild interstitial edema. She was treated with breathing treatment, I presume it was on 5 mg x 1.  Hospitalist was asked to admit patient for further evaluation and management.  Review of Systems: Review of systems as noted in the HPI. All other systems reviewed and are negative.   Past Medical History:  Diagnosis Date   Asthma    Medical history non-contributory    Past Surgical History:  Procedure Laterality Date   NO PAST SURGERIES      Social History:  reports that she quit smoking about 3 years ago. Her smoking use included cigarettes. She has a 25.00 pack-year smoking history. She has never used smokeless  tobacco. She reports current alcohol use. She reports current drug use. Drug: Marijuana.   No Known Allergies  Family History  Problem Relation Age of Onset   Hypertension Mother    Hypertension Father      Prior to Admission medications   Medication Sig Start Date End Date Taking? Authorizing Provider  albuterol (VENTOLIN HFA) 108 (90 Base) MCG/ACT inhaler Inhale 2 puffs into the lungs every 6 (six) hours as needed for wheezing or shortness of breath. 03/16/20   Amin, Loura Halt, MD  albuterol (VENTOLIN HFA) 108 (90 Base) MCG/ACT inhaler Inhale 2 puffs into the lungs every 6 (six) hours as needed for wheezing or shortness of breath. 10/16/20   Burnadette Pop, MD  Fluticasone-Salmeterol (ADVAIR DISKUS) 100-50 MCG/DOSE AEPB Inhale 1 puff into the lungs in the morning and at bedtime. 10/16/20 10/16/21  Burnadette Pop, MD  ipratropium-albuterol (DUONEB) 0.5-2.5 (3) MG/3ML SOLN Take 3 mLs by nebulization every 6 (six) hours as needed. 02/25/19   [name], MD  ipratropium-albuterol (DUONEB) 0.5-2.5 (3) MG/3ML SOLN Take 3 mLs by nebulization every 4 (four) hours as needed. 03/16/20   Amin, Ankit Chirag, MD  montelukast (SINGULAIR) 10 MG tablet Take 1 tablet (10 mg total) by mouth at bedtime. 10/16/20 10/16/21  Burnadette Pop, MD  Multiple Vitamin (MULTIVITAMIN WITH MINERALS) TABS tablet Take 1 tablet by mouth daily. Patient not taking: Reported on 10/15/2020 02/25/19   [name], MD    Physical Exam: BP (!) 149/94 (BP Location: Right Arm)   Pulse 92   Temp 98.2  F (36.8 C) (Oral)   Resp 20   Ht 5\' 8"  (1.727 m)   Wt 126.3 kg   SpO2 92%   BMI 42.34 kg/m   General: 42 y.o. year-old female well developed well nourished in no acute distress.  Alert and oriented x3. HEENT: NCAT, EOMI Neck: Supple, trachea medial Cardiovascular: Regular rate and rhythm with no rubs or gallops.  No thyromegaly or JVD noted.  No lower extremity edema. 2/4 pulses in all 4 extremities. Respiratory: Diffuse  expiratory wheezes on auscultation with no rales.  Abdomen: Soft, nontender nondistended with normal bowel sounds x4 quadrants. Muskuloskeletal: No cyanosis, clubbing or edema noted bilaterally Neuro: CN II-XII intact, strength 5/5 x 4, sensation, reflexes intact Skin: No ulcerative lesions noted or rashes Psychiatry: Mood is appropriate for condition and setting          Labs on Admission:  Basic Metabolic Panel: Recent Labs  Lab 06/09/23 2213  NA 135  K 3.3*  CL 102  CO2 22  GLUCOSE 154*  BUN 10  CREATININE 0.94  CALCIUM 8.5*   Liver Function Tests: Recent Labs  Lab 06/09/23 2213  AST 16  ALT 16  ALKPHOS 97  BILITOT 1.1  PROT 7.9  ALBUMIN 4.0   No results for input(s): "LIPASE", "AMYLASE" in the last 168 hours. No results for input(s): "AMMONIA" in the last 168 hours. CBC: Recent Labs  Lab 06/09/23 2213  WBC 20.1*  NEUTROABS 18.2*  HGB 12.1  HCT 38.3  MCV 76.9*  PLT 363   Cardiac Enzymes: No results for input(s): "CKTOTAL", "CKMB", "CKMBINDEX", "TROPONINI" in the last 168 hours.  BNP (last 3 results) No results for input(s): "BNP" in the last 8760 hours.  ProBNP (last 3 results) No results for input(s): "PROBNP" in the last 8760 hours.  CBG: No results for input(s): "GLUCAP" in the last 168 hours.  Radiological Exams on Admission: DG Chest 2 View  Result Date: 06/09/2023 CLINICAL DATA:  Shortness of breath. EXAM: CHEST - 2 VIEW COMPARISON:  November 14, 2020 FINDINGS: The heart size and mediastinal contours are within normal limits. Mild, diffusely increased interstitial lung markings are seen. There is no evidence of focal consolidation, pleural effusion or pneumothorax. The visualized skeletal structures are unremarkable. IMPRESSION: Increased interstitial lung markings which may represent sequelae associated with mild interstitial edema. Electronically Signed   By: Aram Candela M.D.   On: 06/09/2023 20:17    EKG: I independently viewed the EKG  done and my findings are as followed: Sinus tachycardia at a rate of 113 bpm  Assessment/Plan Present on Admission:  Acute asthma exacerbation  Leukocytosis  Principal Problem:   Acute asthma exacerbation Active Problems:   Leukocytosis   Hypokalemia   Acute respiratory failure with hypoxia (HCC)   Obesity, Class III, BMI 40-49.9 (morbid obesity) (HCC)  Acute asthma exacerbation Acute respiratory failure with hypoxia Continue duo nebs, albuterol, Mucinex, Solu-Medrol, azithromycin. Continue Protonix to prevent steroid-induced ulcer Continue incentive spirometry and flutter valve Continue supplemental oxygen to maintain O2 sat > 92% with plan to wean patient off oxygen as tolerated  Hypokalemia K+ 3.3, this will be replenished  Leukocytosis possibly reactive WBC 20.1, continue to monitor WBC with morning labs  Morbid obesity (BMI 42.34) Lifestyle modification Patient will need to follow-up with PCP for weight loss  DVT prophylaxis: Lovenox   Advance Care Planning: Full code  Consults: None  Family Communication: Significant other at bedside (all questions answered to satisfaction)  Severity of Illness: The appropriate patient  status for this patient is OBSERVATION. Observation status is judged to be reasonable and necessary in order to provide the required intensity of service to ensure the patient's safety. The patient's presenting symptoms, physical exam findings, and initial radiographic and laboratory data in the context of their medical condition is felt to place them at decreased risk for further clinical deterioration. Furthermore, it is anticipated that the patient will be medically stable for discharge from the hospital within 2 midnights of admission.   Author: Frankey Shown, DO 06/10/2023 5:54 AM  For on call review [email or web address].

## 2023-06-09 NOTE — ED Triage Notes (Signed)
Pt arrived via POV c/o SOB, with Hx of Asthma exacerbation. Pt 88% O2 on room air in Triage and tachypnic with tachycardia. Pt reports her inhaler has stopped working and needs a "breathing treatment."

## 2023-06-09 NOTE — ED Notes (Signed)
Pt's O2 sats 86% on 3L [place]. Increased O2 to 4L  and respiratory at bedside to give Duoneb.

## 2023-06-10 ENCOUNTER — Telehealth: Payer: Self-pay

## 2023-06-10 DIAGNOSIS — J9601 Acute respiratory failure with hypoxia: Secondary | ICD-10-CM | POA: Insufficient documentation

## 2023-06-10 DIAGNOSIS — J45901 Unspecified asthma with (acute) exacerbation: Secondary | ICD-10-CM

## 2023-06-10 DIAGNOSIS — E876 Hypokalemia: Secondary | ICD-10-CM | POA: Insufficient documentation

## 2023-06-10 DIAGNOSIS — Z720 Tobacco use: Secondary | ICD-10-CM

## 2023-06-10 LAB — URINALYSIS, W/ REFLEX TO CULTURE (INFECTION SUSPECTED)
Bacteria, UA: NONE SEEN
Bilirubin Urine: NEGATIVE
Glucose, UA: 50 mg/dL — AB
Hgb urine dipstick: NEGATIVE
Ketones, ur: NEGATIVE mg/dL
Leukocytes,Ua: NEGATIVE
Nitrite: NEGATIVE
Protein, ur: 30 mg/dL — AB
Specific Gravity, Urine: 1.032 — ABNORMAL HIGH (ref 1.005–1.030)
pH: 5 (ref 5.0–8.0)

## 2023-06-10 LAB — COMPREHENSIVE METABOLIC PANEL
ALT: 16 U/L (ref 0–44)
AST: 18 U/L (ref 15–41)
Albumin: 3.5 g/dL (ref 3.5–5.0)
Alkaline Phosphatase: 92 U/L (ref 38–126)
Anion gap: 9 (ref 5–15)
BUN: 12 mg/dL (ref 6–20)
CO2: 23 mmol/L (ref 22–32)
Calcium: 8.6 mg/dL — ABNORMAL LOW (ref 8.9–10.3)
Chloride: 101 mmol/L (ref 98–111)
Creatinine, Ser: 0.92 mg/dL (ref 0.44–1.00)
GFR, Estimated: >60 mL/min (ref >60)
Glucose, Bld: 212 mg/dL — ABNORMAL HIGH (ref 70–99)
Potassium: 3.8 mmol/L (ref 3.5–5.1)
Sodium: 133 mmol/L — ABNORMAL LOW (ref 135–145)
Total Bilirubin: 0.5 mg/dL (ref 0.3–1.2)
Total Protein: 7.8 g/dL (ref 6.5–8.1)

## 2023-06-10 LAB — RESPIRATORY PANEL BY PCR

## 2023-06-10 LAB — CBC
HCT: 38.3 % (ref 36.0–46.0)
Hemoglobin: 12 g/dL (ref 12.0–15.0)
MCH: 24.4 pg — ABNORMAL LOW (ref 26.0–34.0)
MCHC: 31.3 g/dL (ref 30.0–36.0)
MCV: 78 fL — ABNORMAL LOW (ref 80.0–100.0)
Platelets: 357 10*3/uL (ref 150–400)
RBC: 4.91 MIL/uL (ref 3.87–5.11)
RDW: 19.7 % — ABNORMAL HIGH (ref 11.5–15.5)
WBC: 16.5 10*3/uL — ABNORMAL HIGH (ref 4.0–10.5)
nRBC: 0 % (ref 0.0–0.2)

## 2023-06-10 LAB — PHOSPHORUS: Phosphorus: 2.8 mg/dL (ref 2.5–4.6)

## 2023-06-10 LAB — PROCALCITONIN: Procalcitonin: <0.1 ng/mL

## 2023-06-10 LAB — MAGNESIUM: Magnesium: 1.9 mg/dL (ref 1.7–2.4)

## 2023-06-10 LAB — HIV ANTIBODY (ROUTINE TESTING W REFLEX): HIV Screen 4th Generation wRfx: NONREACTIVE

## 2023-06-10 MED ORDER — IPRATROPIUM-ALBUTEROL 0.5-2.5 (3) MG/3ML IN SOLN: 3.0000 mL | RESPIRATORY_TRACT | Status: DC | PRN

## 2023-06-10 MED ORDER — ALBUTEROL SULFATE (2.5 MG/3ML) 0.083% IN NEBU
2.5000 mg | INHALATION_SOLUTION | Freq: Four times a day (QID) | RESPIRATORY_TRACT | Status: DC
  Administered 2023-06-10: 2.5 mg via RESPIRATORY_TRACT
  Filled 2023-06-10: qty 3

## 2023-06-10 MED ORDER — ENOXAPARIN SODIUM 40 MG/0.4ML IJ SOSY
40.0000 mg | PREFILLED_SYRINGE | INTRAMUSCULAR | Status: DC
  Filled 2023-06-10: qty 0.4

## 2023-06-10 MED ORDER — AZITHROMYCIN 250 MG PO TABS
250.0000 mg | ORAL_TABLET | Freq: Every day | ORAL | Status: DC
  Administered 2023-06-11: 250 mg via ORAL
  Filled 2023-06-10: qty 1

## 2023-06-10 MED ORDER — ONDANSETRON HCL 4 MG/2ML IJ SOLN: 4.0000 mg | Freq: Four times a day (QID) | INTRAMUSCULAR | Status: DC | PRN

## 2023-06-10 MED ORDER — POTASSIUM CHLORIDE CRYS ER 20 MEQ PO TBCR
40.0000 meq | EXTENDED_RELEASE_TABLET | Freq: Once | ORAL | Status: AC
  Administered 2023-06-10: 40 meq via ORAL
  Filled 2023-06-10: qty 2

## 2023-06-10 MED ORDER — IPRATROPIUM-ALBUTEROL 0.5-2.5 (3) MG/3ML IN SOLN
3.0000 mL | Freq: Four times a day (QID) | RESPIRATORY_TRACT | Status: DC
  Administered 2023-06-10 – 2023-06-11 (×5): 3 mL via RESPIRATORY_TRACT
  Filled 2023-06-10 (×5): qty 3

## 2023-06-10 MED ORDER — ACETAMINOPHEN 325 MG PO TABS
650.0000 mg | ORAL_TABLET | Freq: Four times a day (QID) | ORAL | Status: DC | PRN
  Administered 2023-06-11: 650 mg via ORAL
  Filled 2023-06-10: qty 2

## 2023-06-10 MED ORDER — BUDESONIDE 0.5 MG/2ML IN SUSP
0.5000 mg | Freq: Two times a day (BID) | RESPIRATORY_TRACT | Status: DC
  Administered 2023-06-10 – 2023-06-11 (×3): 0.5 mg via RESPIRATORY_TRACT
  Filled 2023-06-10 (×3): qty 2

## 2023-06-10 MED ORDER — DM-GUAIFENESIN ER 30-600 MG PO TB12
1.0000 | ORAL_TABLET | Freq: Two times a day (BID) | ORAL | Status: DC
  Administered 2023-06-10 – 2023-06-11 (×3): 1 via ORAL
  Filled 2023-06-10 (×3): qty 1

## 2023-06-10 MED ORDER — PANTOPRAZOLE SODIUM 40 MG PO TBEC
40.0000 mg | DELAYED_RELEASE_TABLET | Freq: Every day | ORAL | Status: DC
  Administered 2023-06-10 – 2023-06-11 (×2): 40 mg via ORAL
  Filled 2023-06-10 (×2): qty 1

## 2023-06-10 MED ORDER — AZITHROMYCIN 250 MG PO TABS
500.0000 mg | ORAL_TABLET | Freq: Every day | ORAL | Status: AC
  Administered 2023-06-10: 500 mg via ORAL
  Filled 2023-06-10: qty 2

## 2023-06-10 MED ORDER — METHYLPREDNISOLONE SODIUM SUCC 40 MG IJ SOLR
40.0000 mg | Freq: Two times a day (BID) | INTRAMUSCULAR | Status: DC
  Administered 2023-06-10: 40 mg via INTRAVENOUS
  Filled 2023-06-10: qty 1

## 2023-06-10 MED ORDER — ONDANSETRON HCL 4 MG PO TABS: 4.0000 mg | ORAL_TABLET | Freq: Four times a day (QID) | ORAL | Status: DC | PRN

## 2023-06-10 MED ORDER — ALBUTEROL SULFATE (2.5 MG/3ML) 0.083% IN NEBU: 2.5000 mg | INHALATION_SOLUTION | Freq: Four times a day (QID) | RESPIRATORY_TRACT | Status: DC

## 2023-06-10 MED ORDER — METHYLPREDNISOLONE SODIUM SUCC 125 MG IJ SOLR
60.0000 mg | Freq: Two times a day (BID) | INTRAMUSCULAR | Status: AC
  Administered 2023-06-10 – 2023-06-11 (×2): 60 mg via INTRAVENOUS
  Filled 2023-06-10 (×2): qty 2

## 2023-06-10 NOTE — Progress Notes (Signed)
Pt refused levonox this morning. States she doesn't need it, she move and walks in the room. Notified MD.

## 2023-06-10 NOTE — Progress Notes (Deleted)
95% on RA. Nasal cannula removed.

## 2023-06-10 NOTE — TOC Initial Note (Signed)
Transition of Care [facility]) - Initial/Assessment Note    Patient Details  Name: Rebekah Evans MRN: [id number] Date of Birth: 1981/08/17  [facility] of [facility]) CM/SW Contact:    Annice Needy, LCSW Phone Number: 06/10/2023, 12:30 PM  Clinical Narrative:                 Patient from home where she lives with "a few people" that she describes as friends. Patient uninsured and has no PCP. Agreeable to referral to [facility]. Referral message made to [facility].   Expected Discharge Plan: Home/Self Care Barriers to Discharge: Continued Medical Work up   Patient Goals and CMS Choice Patient states their goals for this hospitalization and ongoing recovery are:: return home          Expected Discharge Plan and Services       Living arrangements for the past 2 months: Single Family Home                                      Prior Living Arrangements/Services Living arrangements for the past 2 months: Single Family Home Lives with:: Friends Patient language and need for interpreter reviewed:: Yes              Criminal Activity/Legal Involvement Pertinent to Current Situation/Hospitalization: No - Comment as needed  Activities of Daily Living Home Assistive Devices/Equipment: None ADL Screening (condition at time of admission) Patient's cognitive ability adequate to safely complete daily activities?: Yes Is the patient deaf or have difficulty hearing?: No Does the patient have difficulty seeing, even when wearing glasses/contacts?: No Does the patient have difficulty concentrating, remembering, or making decisions?: No Patient able to express need for assistance with ADLs?: Yes Does the patient have difficulty dressing or bathing?: No Independently performs ADLs?: Yes (appropriate for developmental age) Does the patient have difficulty walking or climbing stairs?: No Weakness of Legs: None Weakness of Arms/Hands: None  Permission Sought/Granted                   Emotional Assessment       Orientation: : Oriented to Self, Oriented to Place, Oriented to  Time, Oriented to Situation Alcohol / Substance Use: Not Applicable    Admission diagnosis:  Moderate persistent asthma with exacerbation [J45.41] Acute asthma exacerbation [J45.901] Patient Active Problem List   Diagnosis Date Noted   Hypokalemia 06/10/2023   Acute respiratory failure with hypoxia (HCC) 06/10/2023   Obesity, Class III, BMI 40-49.9 (morbid obesity) (HCC) 06/10/2023   Left breast abscess 10/15/2020   Obesity (BMI 30-39.9) 10/15/2020   Asthma, chronic, unspecified asthma severity, with acute exacerbation 03/15/2020   Hyperglycemia 03/15/2020   Mild tetrahydrocannabinol (THC) abuse 03/15/2020   Leukocytosis 02/23/2019   Acute asthma exacerbation 02/22/2019   Acute bronchitis 11/15/2018   Tobacco abuse 11/15/2018   PCP:  Patient, No Pcp Per Pharmacy:   [id number] - [place] ([place]), [place] - [address] [place] ([place]) [place] [address] Phone: [phone] Fax: [phone]  [id number] - [place], [place] - [address] [facility] [address] [place] [address] Phone: [phone] Fax: [phone]     Social Determinants of Health (SDOH) Social History: SDOH Screenings   Food Insecurity: No Food Insecurity (06/10/2023)  Housing: Low Risk  (06/10/2023)  Transportation Needs: No Transportation Needs (06/10/2023)  Utilities: Not At  Risk (06/10/2023)  Depression (PHQ2-9): Low Risk  (08/12/2019)  Tobacco Use: Medium Risk (06/09/2023)   SDOH Interventions:     Readmission Risk Interventions     No data to display

## 2023-06-10 NOTE — Hospital Course (Signed)
42 year old female with a history of asthma, tobacco abuse, and THC use presenting with 1 day history of shortness of breath that began on 06/08/2023.  The patient has had a nonproductive cough.  She denies any hemoptysis.  She denies any fevers, chills, chest pain, nausea, vomiting or direct abdominal pain.  There is no dysuria or hematuria.  She denies any other illicit drugs.  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 She denies any recent changes in any of her medicines.  She tried using her albuterol inhaler without improvement.  Her last antibiotics use was in April 2024 for sinusitis. In the ED, the patient was afebrile and hemodynamically stable.  Oxygen saturation was 88% on room air.  BMP showed sodium 135, potassium 3.3, bicarbonate 22, serum creatinine 0.94.  LFTs were unremarkable.  WBC 20.1, hemoglobin 12.1, platelets 363.  Chest x-ray showed increased interstitial markings.  VBG showed 7.5 2/36/46/29.  The patient was started on IV Solu-Medrol and bronchodilators.

## 2023-06-10 NOTE — Progress Notes (Signed)
Patient alert and oriented, arrived to the unit via wheelchair from ED earlier in the night. Patients oxygen holding on 2.5 Liters of oxygen, patient rested after arrival to unit. No acute events.

## 2023-06-10 NOTE — Progress Notes (Signed)
PROGRESS NOTE  Rebekah Evans:[id number] DOB: Apr 24, 1981 DOA: 06/09/2023 PCP: Patient, No Pcp Per  Brief History:  42 year old female with a history of asthma, tobacco abuse, and THC use presenting with 1 day history of shortness of breath that began on 06/08/2023.  The patient has had a nonproductive cough.  She denies any hemoptysis.  She denies any fevers, chills, chest pain, nausea, vomiting or direct abdominal pain.  There is no dysuria or hematuria.  She denies any other illicit drugs.  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 She denies any recent changes in any of her medicines.  She tried using her albuterol inhaler without improvement.  Her last antibiotics use was in April 2024 for sinusitis. In the ED, the patient was afebrile and hemodynamically stable.  Oxygen saturation was 88% on room air.  BMP showed sodium 135, potassium 3.3, bicarbonate 22, serum creatinine 0.94.  LFTs were unremarkable.  WBC 20.1, hemoglobin 12.1, platelets 363.  Chest x-ray showed increased interstitial markings.  VBG showed 7.5 2/36/46/29.  The patient was started on IV Solu-Medrol and bronchodilators.   Assessment/Plan: Acute asthma exacerbation -Start Pulmicort -Continue DuoNebs -Continue Solu-Medrol -Viral respiratory panel  Leukemoid reaction -Check PCT -Obtain UA -Personally reviewed chest x-ray--increased interstitial markings without consolidation  Acute respiratory failure with hypoxia -Secondary to asthma exacerbation -Presented with tachypnea and hypoxia with 88% on room air -Stable on 2 L -Wean oxygen for saturation greater 92%  Morbid obesity -BMI 42.34 -Lifestyle modification  Tobacco abuse -Tobacco cessation discussed  THC use -Cessation discussed  Hypokalemia -Replete -Magnesium 1.9       Family Communication:   spouse at bedside 6/28  Consultants:  none  Code Status:  FULL   DVT Prophylaxis:   [place]  Lovenox   Procedures: As Listed in Progress Note Above  Antibiotics: Azithro 6/27>>      Subjective:  Patient states that her breathing is a little better than yesterday.  She still has a nonproductive cough.  She denies any nausea, vomiting or direct abdominal pain.   Objective: Vitals:   06/10/23 0033 06/10/23 0437 06/10/23 0548 06/10/23 0613  BP: (!) 153/88 (!) 149/94    Pulse: (!) 109 92    Resp: 20     Temp: 98.6 F (37 C) 98.2 F (36.8 C)    TempSrc: Oral Oral    SpO2: 93% 92% 99% 99%  Weight: 126.3 kg     Height: 5\' 8"  (1.727 m)      No intake or output data in the 24 hours ending 06/10/23 0710 Weight change:  Exam:  General:  Pt is alert, follows commands appropriately, not in acute distress HEENT: No icterus, No thrush, No neck mass, [place]/AT Cardiovascular: RRR, S1/S2, no rubs, no gallops Respiratory: Bibasilar rales.  Bibasilar exp wheeze Abdomen: Soft/+BS, non tender, non distended, no guarding Extremities: No edema, No lymphangitis, No petechiae, No rashes, no synovitis   Data Reviewed: I have personally reviewed following labs and imaging studies Basic Metabolic Panel: Recent Labs  Lab 06/09/23 2213 06/10/23 0604  NA 135 133*  K 3.3* 3.8  CL 102 101  CO2 22 23  GLUCOSE 154* 212*  BUN 10 12  CREATININE 0.94 0.92  CALCIUM 8.5* 8.6*  MG  --  1.9  PHOS  --  2.8   Liver Function Tests: Recent Labs  Lab 06/09/23 2213 06/10/23 0604  AST 16  18  ALT 16 16  ALKPHOS 97 92  BILITOT 1.1 0.5  PROT 7.9 7.8  ALBUMIN 4.0 3.5   No results for input(s): "LIPASE", "AMYLASE" in the last 168 hours. No results for input(s): "AMMONIA" in the last 168 hours. Coagulation Profile: No results for input(s): "INR", "PROTIME" in the last 168 hours. CBC: Recent Labs  Lab 06/09/23 2213 06/10/23 0604  WBC 20.1* 16.5*  NEUTROABS 18.2*  --   HGB 12.1 12.0  HCT 38.3 38.3  MCV 76.9* 78.0*  PLT 363 357   Cardiac Enzymes: No results for input(s): "CKTOTAL",  "CKMB", "CKMBINDEX", "TROPONINI" in the last 168 hours. BNP: Invalid input(s): "POCBNP" CBG: No results for input(s): "GLUCAP" in the last 168 hours. HbA1C: No results for input(s): "HGBA1C" in the last 72 hours. Urine analysis:    Component Value Date/Time   COLORURINE YELLOW 10/07/2016 1455   APPEARANCEUR HAZY (A) 10/07/2016 1455   LABSPEC 1.020 10/07/2016 1455   PHURINE 6.0 10/07/2016 1455   GLUCOSEU NEGATIVE 10/07/2016 1455   HGBUR LARGE (A) 10/07/2016 1455   BILIRUBINUR NEGATIVE 10/07/2016 1455   KETONESUR NEGATIVE 10/07/2016 1455   PROTEINUR NEGATIVE 10/07/2016 1455   NITRITE NEGATIVE 10/07/2016 1455   LEUKOCYTESUR NEGATIVE 10/07/2016 1455   Sepsis Labs: @LABRCNTIP (procalcitonin:4,lacticidven:4) )No results found for this or any previous visit (from the past 240 hour(s)).   Scheduled Meds:  azithromycin  500 mg Oral Daily   Followed by   Melene Muller ON 06/11/2023] azithromycin  250 mg Oral Daily   budesonide (PULMICORT) nebulizer solution  0.5 mg Nebulization BID   dextromethorphan-guaiFENesin  1 tablet Oral BID   enoxaparin (LOVENOX) injection  40 mg Subcutaneous Q24H   ipratropium-albuterol  3 mL Nebulization Q6H   methylPREDNISolone (SOLU-MEDROL) injection  60 mg Intravenous Q12H   pantoprazole  40 mg Oral Daily   Continuous Infusions:  Procedures/Studies: DG Chest 2 View  Result Date: 06/09/2023 CLINICAL DATA:  Shortness of breath. EXAM: CHEST - 2 VIEW COMPARISON:  November 14, 2020 FINDINGS: The heart size and mediastinal contours are within normal limits. Mild, diffusely increased interstitial lung markings are seen. There is no evidence of focal consolidation, pleural effusion or pneumothorax. The visualized skeletal structures are unremarkable. IMPRESSION: Increased interstitial lung markings which may represent sequelae associated with mild interstitial edema. Electronically Signed   By: Aram Candela M.D.   On: 06/09/2023 20:17    Catarina Hartshorn, DO  [facility]  If 7PM-7AM, please contact night-coverage [email or web address] Password [email or web address] 06/10/2023, 7:10 AM   LOS: 0 days

## 2023-06-10 NOTE — ED Provider Notes (Signed)
[facility] MEDICAL SURGICAL UNIT Provider Note  CSN: [id number] Arrival date & time: 06/09/23 1911  Chief Complaint(s) Shortness of Breath  HPI Rebekah Evans is a 42 y.o. female with PMH asthma who presents emergency department for evaluation of shortness of breath.  States that for the last 24 hours she has gotten progressively more short of breath and albuterol inhaler is not improving symptoms.  Patient arrives 88% on room air and tachypneic with expiratory wheezing.  States that she has had previous hospitalizations for asthma exacerbations in the past.  Denies abdominal pain, chest pain, headache, fever or other systemic symptoms.   Past Medical History Past Medical History:  Diagnosis Date   Asthma    Medical history non-contributory    Patient Active Problem List   Diagnosis Date Noted   Left breast abscess 10/15/2020   Obesity (BMI 30-39.9) 10/15/2020   Asthma exacerbation 03/15/2020   Hyperglycemia 03/15/2020   Mild tetrahydrocannabinol (THC) abuse 03/15/2020   Leukocytosis 02/23/2019   Acute asthma exacerbation 02/22/2019   Acute bronchitis 11/15/2018   Tobacco abuse 11/15/2018   Home Medication(s) Prior to Admission medications   Medication Sig Start Date End Date Taking? Authorizing Provider  albuterol (VENTOLIN HFA) 108 (90 Base) MCG/ACT inhaler Inhale 2 puffs into the lungs every 6 (six) hours as needed for wheezing or shortness of breath. 03/16/20   Amin, Loura Halt, MD  albuterol (VENTOLIN HFA) 108 (90 Base) MCG/ACT inhaler Inhale 2 puffs into the lungs every 6 (six) hours as needed for wheezing or shortness of breath. 10/16/20   Burnadette Pop, MD  Fluticasone-Salmeterol (ADVAIR DISKUS) 100-50 MCG/DOSE AEPB Inhale 1 puff into the lungs in the morning and at bedtime. 10/16/20 10/16/21  Burnadette Pop, MD  ipratropium-albuterol (DUONEB) 0.5-2.5 (3) MG/3ML SOLN Take 3 mLs by nebulization every 6 (six) hours as needed. 02/25/19   [name], MD   ipratropium-albuterol (DUONEB) 0.5-2.5 (3) MG/3ML SOLN Take 3 mLs by nebulization every 4 (four) hours as needed. 03/16/20   Amin, Ankit Chirag, MD  montelukast (SINGULAIR) 10 MG tablet Take 1 tablet (10 mg total) by mouth at bedtime. 10/16/20 10/16/21  Burnadette Pop, MD  Multiple Vitamin (MULTIVITAMIN WITH MINERALS) TABS tablet Take 1 tablet by mouth daily. Patient not taking: Reported on 10/15/2020 02/25/19   [name], MD                                                                                                                                    Past Surgical History Past Surgical History:  Procedure Laterality Date   NO PAST SURGERIES     Family History Family History  Problem Relation Age of Onset   Hypertension Mother    Hypertension Father     Social History Social History   Tobacco Use   Smoking status: Former    Packs/day: 1.00    Years: 25.00    Additional pack years: 0.00  Total pack years: 25.00    Types: Cigarettes    Quit date: 01/15/2020    Years since quitting: 3.4   Smokeless tobacco: Never  Vaping Use   Vaping Use: Never used  Substance Use Topics   Alcohol use: Yes    Comment: socially   Drug use: Yes    Types: Marijuana    Comment: socially   Allergies Patient has no known allergies.  Review of Systems Review of Systems  Respiratory:  Positive for cough, chest tightness, shortness of breath and wheezing.     Physical Exam Vital Signs  I have reviewed the triage vital signs BP (!) 153/88 (BP Location: Left Arm)   Pulse (!) 109   Temp 98.6 F (37 C) (Oral)   Resp 20   Ht 5\' 8"  (1.727 m)   Wt 126.3 kg   SpO2 93%   BMI 42.34 kg/m   Physical Exam Vitals and nursing note reviewed.  Constitutional:      General: She is not in acute distress.    Appearance: She is well-developed.  HENT:     Head: Normocephalic and atraumatic.  Eyes:     Conjunctiva/sclera: Conjunctivae normal.  Cardiovascular:     Rate and Rhythm: Normal rate  and regular rhythm.     Heart sounds: No murmur heard. Pulmonary:     Effort: Tachypnea, accessory muscle usage and respiratory distress present.     Breath sounds: Wheezing present.  Abdominal:     Palpations: Abdomen is soft.     Tenderness: There is no abdominal tenderness.  Musculoskeletal:        General: No swelling.     Cervical back: Neck supple.  Skin:    General: Skin is warm and dry.     Capillary Refill: Capillary refill takes less than 2 seconds.  Neurological:     Mental Status: She is alert.  Psychiatric:        Mood and Affect: Mood normal.     ED Results and Treatments Labs (all labs ordered are listed, but only abnormal results are displayed) Labs Reviewed  COMPREHENSIVE METABOLIC PANEL - Abnormal; Notable for the following components:      Result Value   Potassium 3.3 (*)    Glucose, Bld 154 (*)    Calcium 8.5 (*)    All other components within normal limits  CBC WITH DIFFERENTIAL/PLATELET - Abnormal; Notable for the following components:   WBC 20.1 (*)    MCV 76.9 (*)    MCH 24.3 (*)    RDW 19.8 (*)    Neutro Abs 18.2 (*)    Abs Immature Granulocytes 0.09 (*)    All other components within normal limits  BLOOD GAS, VENOUS - Abnormal; Notable for the following components:   pH, Ven 7.52 (*)    pCO2, Ven 36 (*)    pO2, Ven 46 (*)    Bicarbonate 29.4 (*)    Acid-Base Excess 6.3 (*)    All other components within normal limits  Radiology DG Chest 2 View  Result Date: 06/09/2023 CLINICAL DATA:  Shortness of breath. EXAM: CHEST - 2 VIEW COMPARISON:  November 14, 2020 FINDINGS: The heart size and mediastinal contours are within normal limits. Mild, diffusely increased interstitial lung markings are seen. There is no evidence of focal consolidation, pleural effusion or pneumothorax. The visualized skeletal structures are unremarkable.  IMPRESSION: Increased interstitial lung markings which may represent sequelae associated with mild interstitial edema. Electronically Signed   By: Aram Candela M.D.   On: 06/09/2023 20:17    Pertinent labs & imaging results that were available during my care of the patient were reviewed by me and considered in my medical decision making (see MDM for details).  Medications Ordered in ED Medications  albuterol (VENTOLIN HFA) 108 (90 Base) MCG/ACT inhaler 2 puff (has no administration in time range)  albuterol (PROVENTIL) (2.5 MG/3ML) 0.083% nebulizer solution 2.5 mg (has no administration in time range)  ipratropium-albuterol (DUONEB) 0.5-2.5 (3) MG/3ML nebulizer solution 3 mL (has no administration in time range)  ipratropium-albuterol (DUONEB) 0.5-2.5 (3) MG/3ML nebulizer solution 9 mL (9 mLs Nebulization Given 06/09/23 2045)  methylPREDNISolone sodium succinate (SOLU-MEDROL) 125 mg/2 mL injection 125 mg (125 mg Intravenous Given 06/09/23 2019)  ipratropium-albuterol (DUONEB) 0.5-2.5 (3) MG/3ML nebulizer solution (  Given 06/09/23 2045)  albuterol (PROVENTIL) (2.5 MG/3ML) 0.083% nebulizer solution 10 mg ( Nebulization Canceled Entry 06/09/23 2149)  ipratropium-albuterol (DUONEB) 0.5-2.5 (3) MG/3ML nebulizer solution (  Given 06/09/23 2052)                                                                                                                                     Procedures .Critical Care  Performed by: Glendora Score, MD Authorized by: Glendora Score, MD   Critical care provider statement:    Critical care time (minutes):  30   Critical care was necessary to treat or prevent imminent or life-threatening deterioration of the following conditions:  Respiratory failure   Critical care was time spent personally by me on the following activities:  Development of treatment plan with patient or surrogate, discussions with consultants, evaluation of patient's response to treatment,  examination of patient, ordering and review of laboratory studies, ordering and review of radiographic studies, ordering and performing treatments and interventions, pulse oximetry, re-evaluation of patient's condition and review of old charts   (including critical care time)  Medical Decision Making / ED Course   This patient presents to the ED for concern of shortness of breath, this involves an extensive number of treatment options, and is a complaint that carries with it a high risk of complications and morbidity.  The differential diagnosis includes Pe, PTX, Pulmonary Edema, ARDS, COPD/Asthma, ACS, CHF exacerbation, Arrhythmia, Pericardial Effusion/Tamponade, Anemia, Sepsis, Acidosis/Hypercapnia, Anxiety, Viral URI  MDM: Patient seen emergency room for evaluation of shortness of breath.  Physical exam reveals an ill-appearing tachypneic patient with expiratory wheezing and mild accessory muscle use.  Patient placed on 2 L nasal cannula with improvement of hypoxia.  Laboratory evaluation with leukocytosis to 20.1 but is otherwise unremarkable.  Chest x-ray with no infiltrate but may show some mild edema.  No significant hypercarbia on VBG.  Patient given 3 DuoNebs and steroids with improvement of accessory muscle use but wheezing persistent.  Patient then given a 10 mg continuous albuterol treatment and again symptoms improving but wheezing is still persistent.  Hypoxia still persistent and thus patient will require hospital admission for acute asthma exacerbation.  Patient admitted   Additional history obtained: -Additional history obtained from husband -External records from outside source obtained and reviewed including: Chart review including previous notes, labs, imaging, consultation notes   Lab Tests: -I ordered, reviewed, and interpreted labs.   The pertinent results include:   Labs Reviewed  COMPREHENSIVE METABOLIC PANEL - Abnormal; Notable for the following components:       Result Value   Potassium 3.3 (*)    Glucose, Bld 154 (*)    Calcium 8.5 (*)    All other components within normal limits  CBC WITH DIFFERENTIAL/PLATELET - Abnormal; Notable for the following components:   WBC 20.1 (*)    MCV 76.9 (*)    MCH 24.3 (*)    RDW 19.8 (*)    Neutro Abs 18.2 (*)    Abs Immature Granulocytes 0.09 (*)    All other components within normal limits  BLOOD GAS, VENOUS - Abnormal; Notable for the following components:   pH, Ven 7.52 (*)    pCO2, Ven 36 (*)    pO2, Ven 46 (*)    Bicarbonate 29.4 (*)    Acid-Base Excess 6.3 (*)    All other components within normal limits      EKG   EKG Interpretation Date/Time:  Thursday June 09 2023 19:50:29 EDT Ventricular Rate:  113 PR Interval:  141 QRS Duration:  76 QT Interval:  299 QTC Calculation: 410 R Axis:   42  Text Interpretation: Sinus tachycardia Right atrial enlargement Confirmed by Nadyne Gariepy ([id number]) on 06/10/2023 2:20:23 AM         Imaging Studies ordered: I ordered imaging studies including chest x-ray I independently visualized and interpreted imaging. I agree with the radiologist interpretation   Medicines ordered and prescription drug management: Meds ordered this encounter  Medications   albuterol (VENTOLIN HFA) 108 (90 Base) MCG/ACT inhaler 2 puff   ipratropium-albuterol (DUONEB) 0.5-2.5 (3) MG/3ML nebulizer solution 9 mL   methylPREDNISolone sodium succinate (SOLU-MEDROL) 125 mg/2 mL injection 125 mg   ipratropium-albuterol (DUONEB) 0.5-2.5 (3) MG/3ML nebulizer solution    Augustin Coupe A: cabinet override   albuterol (PROVENTIL) (2.5 MG/3ML) 0.083% nebulizer solution 10 mg   ipratropium-albuterol (DUONEB) 0.5-2.5 (3) MG/3ML nebulizer solution    Carlene Coria M: cabinet override   albuterol (PROVENTIL) (2.5 MG/3ML) 0.083% nebulizer solution    Carlene Coria M: cabinet override   DISCONTD: albuterol (PROVENTIL) (2.5 MG/3ML) 0.083% nebulizer solution 2.5 mg   DISCONTD:  albuterol (PROVENTIL) (2.5 MG/3ML) 0.083% nebulizer solution 2.5 mg   albuterol (PROVENTIL) (2.5 MG/3ML) 0.083% nebulizer solution 2.5 mg   ipratropium-albuterol (DUONEB) 0.5-2.5 (3) MG/3ML nebulizer solution 3 mL    -I have reviewed the patients home medicines and have made adjustments as needed  Critical interventions Multiple DuoNebs, steroids    Cardiac Monitoring: The patient was maintained on a cardiac monitor.  I personally viewed and interpreted the cardiac monitored which showed an underlying rhythm of: Sinus tachycardia  Social Determinants  of Health:  Factors impacting patients care include: none   Reevaluation: After the interventions noted above, I reevaluated the patient and found that they have :improved  Co morbidities that complicate the patient evaluation  Past Medical History:  Diagnosis Date   Asthma    Medical history non-contributory       Dispostion: I considered admission for this patient, and due to persistent asthma exacerbation patient require hospital admission     Final Clinical Impression(s) / ED Diagnoses Final diagnoses:  Moderate persistent asthma with exacerbation     @PCDICTATION @    Glendora Score, MD 06/10/23 0221

## 2023-06-10 NOTE — Progress Notes (Signed)
Been checking on pt throughout the day. No needs or complaints at this time. Call light within reach. Iced water provided.

## 2023-06-10 NOTE — Telephone Encounter (Signed)
Received referral for patient from [employer] investment banker at [facility] to reach out to the patient about [employer]. I will plan to reach out to the patient once she is discharged. Will try to make first contact attempt on Monday 7/1

## 2023-06-11 DIAGNOSIS — J4541 Moderate persistent asthma with (acute) exacerbation: Secondary | ICD-10-CM

## 2023-06-11 LAB — CBC WITH DIFFERENTIAL/PLATELET
Abs Immature Granulocytes: 0.14 10*3/uL — ABNORMAL HIGH (ref 0.00–0.07)
Basophils Absolute: 0 10*3/uL (ref 0.0–0.1)
Basophils Relative: 0 %
Eosinophils Absolute: 0 10*3/uL (ref 0.0–0.5)
Eosinophils Relative: 0 %
HCT: 38.6 % (ref 36.0–46.0)
Hemoglobin: 11.8 g/dL — ABNORMAL LOW (ref 12.0–15.0)
Immature Granulocytes: 1 %
Lymphocytes Relative: 5 %
Lymphs Abs: 1 10*3/uL (ref 0.7–4.0)
MCH: 24.3 pg — ABNORMAL LOW (ref 26.0–34.0)
MCHC: 30.6 g/dL (ref 30.0–36.0)
MCV: 79.6 fL — ABNORMAL LOW (ref 80.0–100.0)
Monocytes Absolute: 0.3 10*3/uL (ref 0.1–1.0)
Monocytes Relative: 2 %
Neutro Abs: 20.3 10*3/uL — ABNORMAL HIGH (ref 1.7–7.7)
Neutrophils Relative %: 92 %
Platelets: 368 10*3/uL (ref 150–400)
RBC: 4.85 MIL/uL (ref 3.87–5.11)
RDW: 19.6 % — ABNORMAL HIGH (ref 11.5–15.5)
WBC: 21.8 10*3/uL — ABNORMAL HIGH (ref 4.0–10.5)
nRBC: 0 % (ref 0.0–0.2)

## 2023-06-11 LAB — BASIC METABOLIC PANEL
Anion gap: 10 (ref 5–15)
BUN: 16 mg/dL (ref 6–20)
CO2: 22 mmol/L (ref 22–32)
Calcium: 9.1 mg/dL (ref 8.9–10.3)
Chloride: 103 mmol/L (ref 98–111)
Creatinine, Ser: 1.01 mg/dL — ABNORMAL HIGH (ref 0.44–1.00)
GFR, Estimated: >60 mL/min (ref >60)
Glucose, Bld: 194 mg/dL — ABNORMAL HIGH (ref 70–99)
Potassium: 4.2 mmol/L (ref 3.5–5.1)
Sodium: 135 mmol/L (ref 135–145)

## 2023-06-11 LAB — MAGNESIUM: Magnesium: 2 mg/dL (ref 1.7–2.4)

## 2023-06-11 MED ORDER — PREDNISONE 20 MG PO TABS: 50.0000 mg | ORAL_TABLET | Freq: Every day | ORAL | Status: DC

## 2023-06-11 MED ORDER — ALBUTEROL SULFATE HFA 108 (90 BASE) MCG/ACT IN AERS: 2.0000 | INHALATION_SPRAY | RESPIRATORY_TRACT | 0 refills | Status: AC | PRN

## 2023-06-11 MED ORDER — ARFORMOTEROL TARTRATE 15 MCG/2ML IN NEBU: 15.0000 ug | INHALATION_SOLUTION | Freq: Two times a day (BID) | RESPIRATORY_TRACT | Status: DC

## 2023-06-11 MED ORDER — PREDNISONE 50 MG PO TABS: 50.0000 mg | ORAL_TABLET | Freq: Every day | ORAL | 0 refills | Status: DC
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
## 2023-06-11 NOTE — Discharge Summary (Signed)
Physician Discharge Summary   Patient: Rebekah Evans MRN: [id number] DOB: [birth year]  Admit date:     06/09/2023  Discharge date: 06/11/23  Discharge Physician: Onalee Hua Torunn Chancellor   PCP: Patient, No Pcp Per   Recommendations at discharge:   Please follow up with primary care provider within 1-2 weeks  Please repeat BMP and CBC in one week     Hospital Course: 42 year old female with a history of asthma, tobacco abuse, and THC use presenting with 1 day history of shortness of breath that began on 06/08/2023.  The patient has had a nonproductive cough.  She denies any hemoptysis.  She denies any fevers, chills, chest pain, nausea, vomiting or direct abdominal pain.  There is no dysuria or hematuria.  She denies any other illicit drugs.  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 She denies any recent changes in any of her medicines.  She tried using her albuterol inhaler without improvement.  Her last antibiotics use was in April 2024 for sinusitis. In the ED, the patient was afebrile and hemodynamically stable.  Oxygen saturation was 88% on room air.  BMP showed sodium 135, potassium 3.3, bicarbonate 22, serum creatinine 0.94.  LFTs were unremarkable.  WBC 20.1, hemoglobin 12.1, platelets 363.  Chest x-ray showed increased interstitial markings.  VBG showed 7.5 2/36/46/29.  The patient was started on IV Solu-Medrol and bronchodilators with clinical improvement.  Assessment and Plan: Acute asthma exacerbation -Started Pulmicort -Continued DuoNebs -Continue Solu-Medrol>>d/c home with prednisone x 4 more days -Viral respiratory panel--neg -d/c home with albuterol rescue inhaler   Leukemoid reaction -Check PCT<0.10 -Obtain UA--neg for pyuria -Personally reviewed chest x-ray--increased interstitial markings without consolidation   Acute respiratory failure with hypoxia -Secondary to asthma exacerbation -Presented with tachypnea and hypoxia with 88% on room  air -Stable on 2 L -Wean oxygen for saturation greater 92% -ambulated on RA on day of discharge--no desaturation   Morbid obesity -BMI 42.34 -Lifestyle modification   Tobacco abuse -Tobacco cessation discussed   THC use -Cessation discussed   Hypokalemia -Repleted -Magnesium 2.0             Consultants: none Procedures performed: none  Disposition: Home Diet recommendation:  Cardiac diet DISCHARGE MEDICATION: Allergies as of 06/11/2023   No Known Allergies      Medication List     TAKE these medications    albuterol 108 (90 Base) MCG/ACT inhaler Commonly known as: VENTOLIN HFA Inhale 2 puffs into the lungs every 2 (two) hours as needed for wheezing or shortness of breath. What changed: when to take this   ipratropium-albuterol 0.5-2.5 (3) MG/3ML Soln Commonly known as: DUONEB Take 3 mLs by nebulization every 6 (six) hours as needed. What changed: reasons to take this   predniSONE 50 MG tablet Commonly known as: DELTASONE Take 1 tablet (50 mg total) by mouth daily with breakfast. Start taking on: June 12, 2023        Discharge Exam: [name] Weights   06/09/23 1944 06/10/23 0033  Weight: 135 kg 126.3 kg   HEENT:  [place]/AT, No thrush, no icterus CV:  RRR, no rub, no S3, no S4 Lung:  bibasilar rales.  Minimal basilar wheeze Abd:  soft/+BS, NT Ext:  No edema, no lymphangitis, no synovitis, no rash   Condition at discharge: stable  The results of significant diagnostics from this hospitalization (including imaging, microbiology, ancillary and laboratory) are listed below for reference.   Imaging Studies: DG Chest  2 View  Result Date: 06/09/2023 CLINICAL DATA:  Shortness of breath. EXAM: CHEST - 2 VIEW COMPARISON:  November 14, 2020 FINDINGS: The heart size and mediastinal contours are within normal limits. Mild, diffusely increased interstitial lung markings are seen. There is no evidence of focal consolidation, pleural effusion or pneumothorax. The  visualized skeletal structures are unremarkable. IMPRESSION: Increased interstitial lung markings which may represent sequelae associated with mild interstitial edema. Electronically Signed   By: Aram Candela M.D.   On: 06/09/2023 20:17    Microbiology: Results for orders placed or performed during the hospital encounter of 06/09/23  Respiratory (~20 pathogens) panel by PCR     Status: None   Collection Time: 06/10/23  8:57 AM   Specimen: Nasopharyngeal Swab; Respiratory  Result Value Ref Range Status   Adenovirus NOT DETECTED NOT DETECTED Final   Coronavirus 229E NOT DETECTED NOT DETECTED Final    Comment: (NOTE) The Coronavirus on the Respiratory Panel, DOES NOT test for the novel  Coronavirus (2019 nCoV)    Coronavirus HKU1 NOT DETECTED NOT DETECTED Final   Coronavirus NL63 NOT DETECTED NOT DETECTED Final   Coronavirus OC43 NOT DETECTED NOT DETECTED Final   Metapneumovirus NOT DETECTED NOT DETECTED Final   Rhinovirus / Enterovirus NOT DETECTED NOT DETECTED Final   Influenza A NOT DETECTED NOT DETECTED Final   Influenza B NOT DETECTED NOT DETECTED Final   Parainfluenza Virus 1 NOT DETECTED NOT DETECTED Final   Parainfluenza Virus 2 NOT DETECTED NOT DETECTED Final   Parainfluenza Virus 3 NOT DETECTED NOT DETECTED Final   Parainfluenza Virus 4 NOT DETECTED NOT DETECTED Final   Respiratory Syncytial Virus NOT DETECTED NOT DETECTED Final   Bordetella pertussis NOT DETECTED NOT DETECTED Final   Bordetella Parapertussis NOT DETECTED NOT DETECTED Final   Chlamydophila pneumoniae NOT DETECTED NOT DETECTED Final   Mycoplasma pneumoniae NOT DETECTED NOT DETECTED Final    Comment: Performed at [facility], [address], [place], [place] [address]    Labs: CBC: Recent Labs  Lab 06/09/23 2213 06/10/23 0604 06/11/23 0348  WBC 20.1* 16.5* 21.8*  NEUTROABS 18.2*  --  20.3*  HGB 12.1 12.0 11.8*  HCT 38.3 38.3 38.6  MCV 76.9* 78.0* 79.6*  PLT 363 357 368   Basic Metabolic  Panel: Recent Labs  Lab 06/09/23 2213 06/10/23 0604 06/11/23 0348  NA 135 133* 135  K 3.3* 3.8 4.2  CL 102 101 103  CO2 22 23 22   GLUCOSE 154* 212* 194*  BUN 10 12 16   CREATININE 0.94 0.92 1.01*  CALCIUM 8.5* 8.6* 9.1  MG  --  1.9 2.0  PHOS  --  2.8  --    Liver Function Tests: Recent Labs  Lab 06/09/23 2213 06/10/23 0604  AST 16 18  ALT 16 16  ALKPHOS 97 92  BILITOT 1.1 0.5  PROT 7.9 7.8  ALBUMIN 4.0 3.5   CBG: No results for input(s): "GLUCAP" in the last 168 hours.  Discharge time spent: greater than 30 minutes.  Signed: Catarina Hartshorn, MD [facility] 06/11/2023

## 2023-06-15 ENCOUNTER — Telehealth: Payer: Self-pay

## 2023-06-15 NOTE — Telephone Encounter (Signed)
Patient has active [employer]. Attempted to reach patient to connect her with a primary care provider. No answer. Left voicemail

## 2023-12-07 ENCOUNTER — Emergency Department (HOSPITAL_COMMUNITY)
Admission: EM | Admit: 2023-12-07 | Discharge: 2023-12-07 | Disposition: A | Discharge status: Home / Self Care | Payer: 59 | Attending: Emergency Medicine | Admitting: Emergency Medicine

## 2023-12-07 ENCOUNTER — Emergency Department (HOSPITAL_BASED_OUTPATIENT_CLINIC_OR_DEPARTMENT_OTHER): Payer: 59

## 2023-12-07 ENCOUNTER — Encounter (HOSPITAL_COMMUNITY): Payer: Self-pay

## 2023-12-07 ENCOUNTER — Other Ambulatory Visit: Payer: Self-pay

## 2023-12-07 DIAGNOSIS — M79604 Pain in right leg: Secondary | ICD-10-CM | POA: Insufficient documentation

## 2023-12-07 DIAGNOSIS — J45909 Unspecified asthma, uncomplicated: Secondary | ICD-10-CM | POA: Diagnosis not present

## 2023-12-07 DIAGNOSIS — M25569 Pain in unspecified knee: Secondary | ICD-10-CM

## 2023-12-07 DIAGNOSIS — T391X1A Poisoning by 4-Aminophenol derivatives, accidental (unintentional), initial encounter: Secondary | ICD-10-CM | POA: Diagnosis present

## 2023-12-07 DIAGNOSIS — F17200 Nicotine dependence, unspecified, uncomplicated: Secondary | ICD-10-CM | POA: Diagnosis not present

## 2023-12-07 DIAGNOSIS — M7989 Other specified soft tissue disorders: Secondary | ICD-10-CM | POA: Diagnosis not present

## 2023-12-07 LAB — COMPREHENSIVE METABOLIC PANEL
ALT: 22 U/L (ref 0–44)
AST: 18 U/L (ref 15–41)
Albumin: 3.2 g/dL — ABNORMAL LOW (ref 3.5–5.0)
Alkaline Phosphatase: 59 U/L (ref 38–126)
Anion gap: 8 (ref 5–15)
BUN: 13 mg/dL (ref 6–20)
CO2: 21 mmol/L — ABNORMAL LOW (ref 22–32)
Calcium: 8.8 mg/dL — ABNORMAL LOW (ref 8.9–10.3)
Chloride: 106 mmol/L (ref 98–111)
Creatinine, Ser: 1.08 mg/dL — ABNORMAL HIGH (ref 0.44–1.00)
GFR, Estimated: >60 mL/min (ref >60)
Glucose, Bld: 108 mg/dL — ABNORMAL HIGH (ref 70–99)
Potassium: 3.9 mmol/L (ref 3.5–5.1)
Sodium: 135 mmol/L (ref 135–145)
Total Bilirubin: 0.5 mg/dL (ref <1.2)
Total Protein: 6.3 g/dL — ABNORMAL LOW (ref 6.5–8.1)

## 2023-12-07 LAB — CBC WITH DIFFERENTIAL/PLATELET
Abs Immature Granulocytes: 0.01 10*3/uL (ref 0.00–0.07)
Basophils Absolute: 0 10*3/uL (ref 0.0–0.1)
Basophils Relative: 1 %
Eosinophils Absolute: 0.1 10*3/uL (ref 0.0–0.5)
Eosinophils Relative: 2 %
HCT: 41.8 % (ref 36.0–46.0)
Hemoglobin: 12.9 g/dL (ref 12.0–15.0)
Immature Granulocytes: 0 %
Lymphocytes Relative: 40 %
Lymphs Abs: 2.9 10*3/uL (ref 0.7–4.0)
MCH: 25.3 pg — ABNORMAL LOW (ref 26.0–34.0)
MCHC: 30.9 g/dL (ref 30.0–36.0)
MCV: 82.1 fL (ref 80.0–100.0)
Monocytes Absolute: 0.6 10*3/uL (ref 0.1–1.0)
Monocytes Relative: 8 %
Neutro Abs: 3.5 10*3/uL (ref 1.7–7.7)
Neutrophils Relative %: 49 %
Platelets: 334 10*3/uL (ref 150–400)
RBC: 5.09 MIL/uL (ref 3.87–5.11)
RDW: 19.1 % — ABNORMAL HIGH (ref 11.5–15.5)
WBC: 7.1 10*3/uL (ref 4.0–10.5)
nRBC: 0 % (ref 0.0–0.2)

## 2023-12-07 LAB — ETHANOL: Alcohol, Ethyl (B): <10 mg/dL (ref <10)

## 2023-12-07 LAB — MAGNESIUM: Magnesium: 1.7 mg/dL (ref 1.7–2.4)

## 2023-12-07 LAB — ACETAMINOPHEN LEVEL
Acetaminophen (Tylenol), Serum: <10 ug/mL — ABNORMAL LOW (ref 10–30)
Acetaminophen (Tylenol), Serum: <10 ug/mL — ABNORMAL LOW (ref 10–30)

## 2023-12-07 LAB — SALICYLATE LEVEL: Salicylate Lvl: <7 mg/dL — ABNORMAL LOW (ref 7.0–30.0)

## 2023-12-07 MED ORDER — ONDANSETRON HCL 4 MG/2ML IJ SOLN: 4.0000 mg | Freq: Once | INTRAMUSCULAR | Status: DC

## 2023-12-07 NOTE — Discharge Instructions (Signed)
Thank you for letting us evaluate you today.  Your 2 Tylenol levels were not significantly elevated not requiring ED intervention at this time.  Your heart does not show signs of injury.  Your imaging of your leg does not show a blood clot in the leg.  Please follow-up with your PCP regarding this nodule.  I have included [facility] as a reference if you do not have a PCP  Return to emergency department if you experience cold pale leg to suggest tissue damage or difficulty in perfusing, red hot swollen tender leg.

## 2023-12-07 NOTE — ED Provider Notes (Signed)
[place] EMERGENCY DEPARTMENT AT [facility] Provider Note   CSN: [id number] Arrival date & time: 12/07/23  1046     History  Chief Complaint  Patient presents with   Knee Pain   Drug Overdose    Rebekah Evans is a 42 y.o. female.  The history is provided by the patient, medical records and the EMS personnel. No language interpreter was used.  Knee Pain Drug Overdose     42 year old female significant history of obesity, asthma, tobacco use brought here via EMS with concerns of drug overdose.  Per EMS, patient developed atraumatic right leg pain since yesterday.  She mention she did not have any available pain medication and therefore she proceeded to consume a large amount of NyQuil that she had available.  Without any significant improvement and she called EMS.  They believe patient has consumed a total of 7500 mg of Tylenol throughout the span of 12 hours.  At this time patient was noted to be drowsy.  I was able to obtain history from patient, she admits that she is having sharp throbbing pain about her right leg behind her right knee since yesterday.  She admits she took NyQuil as a way to help with her pain as she does not have any other available pain medication.  She endorsed feeling nauseous and did vomit twice while she was in the EMS and now felt better.  She endorsed feeling a bit drowsy but denies any severe headache, chest pain, trouble breathing, abdominal pain and she denies any shortness of breath.  No prior history of PE or DVT no recent surgery prolonged bedrest active cancer being on oral hormone.  She denies suicidal or homicidal ideation.  She admits that this is an unintentional overdose.  Home Medications Prior to Admission medications   Medication Sig Start Date End Date Taking? Authorizing Provider  albuterol (VENTOLIN HFA) 108 (90 Base) MCG/ACT inhaler Inhale 2 puffs into the lungs every 2 (two) hours as needed for wheezing or shortness of  breath. 06/11/23   Tat, Onalee Hua, MD  ipratropium-albuterol (DUONEB) 0.5-2.5 (3) MG/3ML SOLN Take 3 mLs by nebulization every 6 (six) hours as needed. Patient taking differently: Take 3 mLs by nebulization every 6 (six) hours as needed (Wheezing/SOB). 02/25/19   [name], MD  predniSONE (DELTASONE) 50 MG tablet Take 1 tablet (50 mg total) by mouth daily with breakfast. 06/12/23   Catarina Hartshorn, MD      Allergies    Patient has no known allergies.    Review of Systems   Review of Systems  All other systems reviewed and are negative.   Physical Exam Updated Vital Signs BP 129/85 (BP Location: Left Arm)   Pulse 84   Temp 98.9 F (37.2 C) (Oral)   Resp 14   SpO2 99%  Physical Exam Vitals and nursing note reviewed.  Constitutional:      General: She is not in acute distress.    Appearance: She is well-developed. She is obese.     Comments: Patient is slightly drowsy but easily arousable and answer question appropriately.  She appears to be in no acute discomfort.  HENT:     Head: Atraumatic.  Eyes:     General: No scleral icterus.    Extraocular Movements: Extraocular movements intact.     Conjunctiva/sclera: Conjunctivae normal.     Pupils: Pupils are equal, round, and reactive to light.  Cardiovascular:     Rate and Rhythm: Normal rate and regular  rhythm.     Pulses: Normal pulses.     Heart sounds: Normal heart sounds.  Pulmonary:     Effort: Pulmonary effort is normal.  Abdominal:     Palpations: Abdomen is soft.     Tenderness: There is no abdominal tenderness.  Musculoskeletal:        General: Tenderness (Right leg: Tenderness noted to the posterior thigh without any erythema edema or warmth.  A small subcutaneous nodule can be palpated no fluctuance.) present.     Cervical back: Normal range of motion and neck supple.  Skin:    Findings: No rash.  Neurological:     Mental Status: She is alert.  Psychiatric:        Mood and Affect: Mood normal.     ED Results /  Procedures / Treatments   Labs (all labs ordered are listed, but only abnormal results are displayed) Labs Reviewed  ACETAMINOPHEN LEVEL - Abnormal; Notable for the following components:      Result Value   Acetaminophen (Tylenol), Serum <10 (*)    All other components within normal limits  COMPREHENSIVE METABOLIC PANEL - Abnormal; Notable for the following components:   CO2 21 (*)    Glucose, Bld 108 (*)    Creatinine, Ser 1.08 (*)    Calcium 8.8 (*)    Total Protein 6.3 (*)    Albumin 3.2 (*)    All other components within normal limits  SALICYLATE LEVEL - Abnormal; Notable for the following components:   Salicylate Lvl <7.0 (*)    All other components within normal limits  CBC WITH DIFFERENTIAL/PLATELET - Abnormal; Notable for the following components:   MCH 25.3 (*)    RDW 19.1 (*)    All other components within normal limits  ETHANOL  MAGNESIUM  URINALYSIS, ROUTINE W REFLEX MICROSCOPIC  ACETAMINOPHEN LEVEL    EKG None  Radiology VAS [place] LOWER EXTREMITY VENOUS (DVT) (7a-7p) Result Date: 12/07/2023  Lower Venous DVT Study Patient Name:  JALEEN SCHLADER  Date of Exam:   12/07/2023 Medical Rec #: [id number]        Accession #:    [id number] Date of Birth: 12/09/1981         Patient Gender: F Patient Age:   69 years Exam Location:  [facility] Procedure:      VAS [place] LOWER EXTREMITY VENOUS (DVT) Referring Phys: Fayrene Helper --------------------------------------------------------------------------------  Indications: Edema, and Swelling.  Risk Factors: Obesity. Limitations: Body habitus and Unable to scan left CFV. Comparison Study: None. Performing Technologist: Shona Simpson  Examination Guidelines: A complete evaluation includes B-mode imaging, spectral Doppler, color Doppler, and power Doppler as needed of all accessible portions of each vessel. Bilateral testing is considered an integral part of a complete examination. Limited examinations for reoccurring indications may be  performed as noted. The reflux portion of the exam is performed with the patient in reverse Trendelenburg.  +---------+---------------+---------+-----------+----------+--------------+ RIGHT    CompressibilityPhasicitySpontaneityPropertiesThrombus Aging +---------+---------------+---------+-----------+----------+--------------+ CFV      Full           Yes      Yes                                 +---------+---------------+---------+-----------+----------+--------------+ SFJ      Full                                                        +---------+---------------+---------+-----------+----------+--------------+  FV Prox  Full                                                        +---------+---------------+---------+-----------+----------+--------------+ FV Mid   Full                                                        +---------+---------------+---------+-----------+----------+--------------+ FV DistalFull                                                        +---------+---------------+---------+-----------+----------+--------------+ PFV      Full                                                        +---------+---------------+---------+-----------+----------+--------------+ POP      Full           Yes      Yes                                 +---------+---------------+---------+-----------+----------+--------------+ PTV      Full                                                        +---------+---------------+---------+-----------+----------+--------------+ PERO     Full                                                        +---------+---------------+---------+-----------+----------+--------------+   +----+---------------+---------+-----------+----------+--------------+ LEFTCompressibilityPhasicitySpontaneityPropertiesThrombus Aging +----+---------------+---------+-----------+----------+--------------+ CFV Full           Yes       Yes                                 +----+---------------+---------+-----------+----------+--------------+    Summary: RIGHT: - There is no evidence of deep vein thrombosis in the lower extremity.  - No cystic structure found in the popliteal fossa.   *See table(s) above for measurements and observations.    Preliminary     Procedures Procedures    Medications Ordered in ED Medications - No data to display  ED Course/ Medical Decision Making/ A&P                                 Medical Decision Making Amount and/or Complexity of Data Reviewed Labs: ordered.   BP 129/85 (BP Location: Left  Arm)   Pulse 84   Temp 98.9 F (37.2 C) (Oral)   Resp 14   SpO2 99%   82:55 AM  42 year old female significant history of obesity, asthma, tobacco use brought here via EMS with concerns of drug overdose.  Per EMS, patient developed atraumatic right leg pain since yesterday.  She mention she did not have any available pain medication and therefore she proceeded to consume a large amount of NyQuil that she had available.  Without any significant improvement and she called EMS.  They believe patient has consumed a total of 7500 mg of Tylenol throughout the span of 12 hours.  At this time patient was noted to be drowsy.  I was able to obtain history from patient, she admits that she is having sharp throbbing pain about her right leg behind her right knee since yesterday.  She admits she took NyQuil as a way to help with her pain as she does not have any other available pain medication.  She endorsed feeling nauseous and did vomit twice while she was in the EMS and now felt better.  She endorsed feeling a bit drowsy but denies any severe headache, chest pain, trouble breathing, abdominal pain and she denies any shortness of breath.  No prior history of PE or DVT no recent surgery prolonged bedrest active cancer being on oral hormone.  She denies suicidal or homicidal ideation.  She admits that this is an  unintentional overdose.  Exam notable for patient is drowsy but easily arousable and appears to be in no acute discomfort.  She does have some tenderness noted to her right posterior knee and distal thigh.  There is a small subcutaneous nodule that can be palpated but no obvious signs of infection.  She is able to flex and extend her knee.  No significant peripheral edema noted and she is neurovascular intact.  11:09 AM I have reached out to poison control to get their guidance.  Per poison control, obtain appropriate labs which include tylenol level, EKG, alcohol level, CMP, magnesium level.  Patient should also have a repeat Tylenol level in 4 hours.  Consider first time of ingestion at 11 PM last night.  If patient has abnormal Tylenol level or abnormal liver function panel then to use the Shelbie Hutching scale for treatment with acetylcysteine.  Also monitor for any anticholinergic reaction and to treat with fluid and benzodiazepine as appropriate.  Important to check EKG for any signs of prolonged QT and to optimize potassium and magnesium level.  Patient should be monitored for at least 6 hours until back to baseline.  -Labs ordered, independently viewed and interpreted by me.  Labs remarkable for initial tylenol level is normal, will recheck 4 hrs tylenol level.  Normal hepatic function panel.   -The patient was maintained on a cardiac monitor.  I personally viewed and interpreted the cardiac monitored which showed an underlying rhythm of: NSR, no prolonged QT -Imaging independently viewed and interpreted by me and I agree with radiologist's interpretation.  Result remarkable for vascular US of RLE without evidence of DVT -This patient presents to the ED for concern of unintentional drug OD, this involves an extensive number of treatment options, and is a complaint that carries with it a high risk of complications and morbidity.  The differential diagnosis includes tylenol toxicity, alcohol  intoxication, prolonged QT, liver failure, electrolytes imbalance -Co morbidities that complicate the patient evaluation includes asthma, obsiety -Treatment includes monitoring, consultation with Poison Control -Reevaluation of  the patient after these medicines showed that the patient improved -PCP office notes or outside notes reviewed -Discussion with Poison Control for plan of care.  Care also discussed with Dr. Hyacinth Meeker -Escalation to admission/observation considered: dispo pending 2nd tylenol level and reassessment.  Pt sign out to oncoming provider.          Final Clinical Impression(s) / ED Diagnoses Final diagnoses:  Accidental acetaminophen overdose, initial encounter    Rx / DC Orders ED Discharge Orders     None         Fayrene Helper, PA-C 12/07/23 1525    Eber Hong, MD 12/08/23 [phone]

## 2023-12-07 NOTE — ED Notes (Signed)
RN spoke with poison control, if next tylenol level is <10, she is cleared. RN to call immediately if tylenol level is >10. RN spoke with Almira Coaster at [employer] [phone])

## 2023-12-07 NOTE — ED Provider Notes (Cosign Needed Addendum)
I received this patient at signout from previous provider.  See his note.  In short, patient is brought to emergency department via EMS for complaints of atraumatic right leg pain that started yesterday.  Due to this, she consumed a large amount of NyQuil for the pain.  Poison control recommended to monitor for 6 hours and recheck Tylenol at 4 hours.    Physical exam significant for a small subcutaneous palpable tender nodule behind knee.  She is able to flex and extend her knee without difficulty. No overlying or surround skin changes. No swelling of knee. DVT study negative for DVT. Discussed patient to follow up with PCP regarding further management and using ibuprofen for pain.  At 4 hours, Tylenol is still undetectable.  There is no transaminitis nor QT prolongation.  At this time, I feel the patient is safe for discharge.  Discharge instructions explained to patient.  All questions answered to her satisfaction.  Patient is agreeable to discharge and follow-up with PCP regarding nodule.    Judithann Sheen, PA 12/07/23 1744    Tegeler, Canary Brim, MD 12/08/23 0040

## 2023-12-07 NOTE — ED Notes (Signed)
Vascular is at bedside.

## 2023-12-07 NOTE — Progress Notes (Signed)
Lower extremity venous duplex completed. Please see CV Procedures for preliminary results.  Shona Simpson, RVT 12/07/23 12:59 PM

## 2023-12-07 NOTE — ED Triage Notes (Signed)
Pt BIB [facility] from home d/t knee pain and OD on NyQuil. Pt took 120z ( whole bottle) of Nyquil trying to put herself to sleep. Pt stated it was an accident. Pt experiencing nausea, jittery and drowsiness. A&O X4. Poison control contacted by EMS.   BP 142/90 PULSE 82  RR 16  SPO2 97

## 2023-12-15 ENCOUNTER — Emergency Department (HOSPITAL_COMMUNITY)
Admission: EM | Admit: 2023-12-15 | Discharge: 2023-12-15 | Disposition: A | Discharge status: Home / Self Care | Payer: 59 | Attending: Emergency Medicine | Admitting: Emergency Medicine

## 2023-12-15 DIAGNOSIS — R062 Wheezing: Secondary | ICD-10-CM | POA: Diagnosis present

## 2023-12-15 DIAGNOSIS — J4521 Mild intermittent asthma with (acute) exacerbation: Secondary | ICD-10-CM | POA: Diagnosis not present

## 2023-12-15 DIAGNOSIS — B349 Viral infection, unspecified: Secondary | ICD-10-CM | POA: Insufficient documentation

## 2023-12-15 DIAGNOSIS — Z1152 Encounter for screening for COVID-19: Secondary | ICD-10-CM | POA: Insufficient documentation

## 2023-12-15 LAB — GROUP A STREP BY PCR: Group A Strep by PCR: NOT DETECTED

## 2023-12-15 LAB — RESP PANEL BY RT-PCR (RSV, FLU A&B, COVID)  RVPGX2
Influenza A by PCR: NEGATIVE
Influenza B by PCR: NEGATIVE
Resp Syncytial Virus by PCR: NEGATIVE
SARS Coronavirus 2 by RT PCR: NEGATIVE

## 2023-12-15 MED ORDER — AZITHROMYCIN 250 MG PO TABS
500.0000 mg | ORAL_TABLET | Freq: Once | ORAL | Status: AC
  Administered 2023-12-15: 500 mg via ORAL
  Filled 2023-12-15: qty 2

## 2023-12-15 MED ORDER — PREDNISONE 20 MG PO TABS
60.0000 mg | ORAL_TABLET | Freq: Once | ORAL | Status: AC
  Administered 2023-12-15: 60 mg via ORAL
  Filled 2023-12-15: qty 3

## 2023-12-15 MED ORDER — PREDNISONE 20 MG PO TABS: ORAL_TABLET | ORAL | 0 refills | Status: AC

## 2023-12-15 MED ORDER — AZITHROMYCIN 250 MG PO TABS: 250.0000 mg | ORAL_TABLET | Freq: Every day | ORAL | 0 refills | Status: AC

## 2023-12-15 MED ORDER — ALBUTEROL SULFATE HFA 108 (90 BASE) MCG/ACT IN AERS: 2.0000 | INHALATION_SPRAY | RESPIRATORY_TRACT | Status: DC | PRN

## 2023-12-15 NOTE — ED Triage Notes (Addendum)
 Pt c/o malaise, cough, headache, sore throat, loss of taste for two days.

## 2023-12-15 NOTE — Discharge Instructions (Signed)
 You have been evaluated for your symptoms.  You are likely experiencing a viral illness which exacerbate your asthma.  Take prednisone  as prescribed. Continue to use your nebulizer and rescue inhaler as needed.  Take antibiotic for coverage of atypical pneumonia.  Return if you have any concerns.

## 2023-12-15 NOTE — ED Provider Notes (Signed)
 [place] EMERGENCY DEPARTMENT AT [facility] Provider Note   CSN: [id number] Arrival date & time: 12/15/23  1534     History  Chief Complaint  Patient presents with   URI    Rebekah Evans is a 43 y.o. female.  The history is provided by the patient and medical records. No language interpreter was used.  URI    43 year old female with significant history of tobacco use, asthma, polysubstance abuse, obesity presenting with cold symptoms.  For the past 2 days patient has had headache, body aches, sore throat, productive cough, wheezing, loss of taste, and overall not feeling well.  No vomiting or diarrhea no abdominal pain.  She has been using inhaler more than usual.  Unsure if there is any recent sick contact.  No history of PE or DVT.  Home Medications Prior to Admission medications   Medication Sig Start Date End Date Taking? Authorizing Provider  albuterol  (VENTOLIN  HFA) 108 (90 Base) MCG/ACT inhaler Inhale 2 puffs into the lungs every 2 (two) hours as needed for wheezing or shortness of breath. 06/11/23   Tat, Alm, MD  Pseudoeph-Doxylamine-DM-APAP (NYQUIL PO) Take 30 mLs by mouth at bedtime as needed (cough).    [provider]      Allergies    Patient has no known allergies.    Review of Systems   Review of Systems  All other systems reviewed and are negative.   Physical Exam Updated Vital Signs BP (!) 142/106 (BP Location: Left Arm)   Pulse 85   Temp 98.1 F (36.7 C) (Oral)   Resp 19   Ht 5' 8 (1.727 m)   SpO2 98%   BMI 42.34 kg/m  Physical Exam Vitals and nursing note reviewed.  Constitutional:      General: She is not in acute distress.    Appearance: She is well-developed.     Comments: Sleepy but easily arousable and appears to be in no acute respiratory discomfort.  HENT:     Head: Atraumatic.     Right Ear: Tympanic membrane normal.     Left Ear: Tympanic membrane normal.     Nose: Nose normal.     Mouth/Throat:      Mouth: Mucous membranes are moist.  Eyes:     Extraocular Movements: Extraocular movements intact.     Conjunctiva/sclera: Conjunctivae normal.     Pupils: Pupils are equal, round, and reactive to light.  Cardiovascular:     Rate and Rhythm: Normal rate and regular rhythm.     Pulses: Normal pulses.     Heart sounds: Normal heart sounds.  Pulmonary:     Effort: Pulmonary effort is normal.     Breath sounds: Wheezing present.  Abdominal:     Palpations: Abdomen is soft.     Tenderness: There is abdominal tenderness.  Musculoskeletal:     Cervical back: Normal range of motion and neck supple. No rigidity or tenderness.  Lymphadenopathy:     Cervical: No cervical adenopathy.  Skin:    Findings: No rash.  Neurological:     Mental Status: She is alert. Mental status is at baseline.  Psychiatric:        Mood and Affect: Mood normal.     ED Results / Procedures / Treatments   Labs (all labs ordered are listed, but only abnormal results are displayed) Labs Reviewed  GROUP A STREP BY PCR  RESP PANEL BY RT-PCR (RSV, FLU A&B, COVID)  [id number]    EKG  None  Radiology No results found.  Procedures Procedures    Medications Ordered in ED Medications  albuterol  (VENTOLIN  HFA) 108 (90 Base) MCG/ACT inhaler 2 puff (has no administration in time range)  predniSONE  (DELTASONE ) tablet 60 mg (60 mg Oral Given 12/15/23 2230)  azithromycin  (ZITHROMAX ) tablet 500 mg (500 mg Oral Given 12/15/23 2230)    ED Course/ Medical Decision Making/ A&P                                 Medical Decision Making  BP (!) 142/106 (BP Location: Left Arm)   Pulse 85   Temp 98.1 F (36.7 C) (Oral)   Resp 19   Ht 5' 8 (1.727 m)   SpO2 98%   BMI 42.34 kg/m   42:55 PM 43 year old female with significant history of tobacco use, asthma, polysubstance abuse, obesity presenting with cold symptoms.  For the past 2 days patient has had headache, body aches, sore throat, productive cough, wheezing, loss of  taste, and overall not feeling well.  No vomiting or diarrhea no abdominal pain.  She has been using inhaler more than usual.  Unsure if there is any recent sick contact.  No history of PE or DVT.  Exam notable for mild inspiratory and expiratory wheezes but lung sounds present.  He denies and throat exam unremarkable.  Patient overall well.  Sleepy but easily arousable.  Vitals are notable for elevated blood pressure of 142/106 patient is afebrile no hypoxia.  Labs obtained including interpreted me and is negative for strep, COVID, flu, or RSV.  Chest x-ray considered but not performed as patient is afebrile and no hypoxic.  Suspect asthma exacerbation in the setting of a viral URI.  However given her impaired lung function, will prescribe prednisone  as well as azithromycin  to treat for potential atypical infection.  Patient voiced understanding and agrees with plan.  She is stable for discharge.  Return precaution given.  Social determinant of health including tobacco use        Final Clinical Impression(s) / ED Diagnoses Final diagnoses:  Viral illness  Mild intermittent asthma with acute exacerbation    Rx / DC Orders ED Discharge Orders          Ordered    azithromycin  (ZITHROMAX ) 250 MG tablet  Daily        12/15/23 2252    predniSONE  (DELTASONE ) 20 MG tablet        12/15/23 2252              Nivia Colon, PA-C 12/15/23 2253    Francesca Elsie CROME, MD 12/15/23 2317

## 2023-12-15 NOTE — ED Notes (Signed)
 Pt d/c home per EDP order. Discharge summary, reviewed, pt verbalizes understanding. Ambulatory off unit. NAD.

## 2023-12-30 ENCOUNTER — Ambulatory Visit (HOSPITAL_COMMUNITY)
Admission: EM | Admit: 2023-12-30 | Discharge: 2023-12-30 | Disposition: A | Discharge status: Home / Self Care | Payer: 59 | Attending: Internal Medicine | Admitting: Internal Medicine

## 2023-12-30 ENCOUNTER — Encounter (HOSPITAL_COMMUNITY): Payer: Self-pay

## 2023-12-30 DIAGNOSIS — N912 Amenorrhea, unspecified: Secondary | ICD-10-CM

## 2023-12-30 LAB — POCT URINE PREGNANCY: Preg Test, Ur: NEGATIVE

## 2023-12-30 NOTE — ED Provider Notes (Signed)
[facility]    CSN: [id number] Arrival date & time: 12/30/23  1549      History   Chief Complaint Chief Complaint  Patient presents with   wants pregnancy test    HPI Rebekah Evans is a 43 y.o. female.   43 year old female who presents to urgent care requesting a pregnancy test.  She reports she has not had a period since October.  She has some mild intermittent abdominal pain.  She denies dysuria, hematuria, diarrhea, vomiting, weight loss, weight gain, increased stressors, new medications, exposure to STIs.  Her last Pap smear was in June and was negative.  She denies any history of PCOS, fibroids or irregular periods.     Past Medical History:  Diagnosis Date   Asthma    Medical history non-contributory     Patient Active Problem List   Diagnosis Date Noted   Hypokalemia 06/10/2023   Acute respiratory failure with hypoxia (HCC) 06/10/2023   Obesity, Class III, BMI 40-49.9 (morbid obesity) (HCC) 06/10/2023   Left breast abscess 10/15/2020   Obesity (BMI 30-39.9) 10/15/2020   Asthma, chronic, unspecified asthma severity, with acute exacerbation 03/15/2020   Hyperglycemia 03/15/2020   Mild tetrahydrocannabinol (THC) abuse 03/15/2020   Leukocytosis 02/23/2019   Acute asthma exacerbation 02/22/2019   Acute bronchitis 11/15/2018   Tobacco abuse 11/15/2018    Past Surgical History:  Procedure Laterality Date   NO PAST SURGERIES      OB History     Gravida  5   Para  0   Term  0   Preterm  0   AB  0   Living         SAB  0   IAB  0   Ectopic  0   Multiple      Live Births               Home Medications    Prior to Admission medications   Medication Sig Start Date End Date Taking? Authorizing Provider  albuterol (VENTOLIN HFA) 108 (90 Base) MCG/ACT inhaler Inhale 2 puffs into the lungs every 2 (two) hours as needed for wheezing or shortness of breath. 06/11/23   Tat, Onalee Hua, MD  azithromycin (ZITHROMAX) 250 MG tablet Take 1  tablet (250 mg total) by mouth daily. 12/15/23   Fayrene Helper, PA-C  predniSONE (DELTASONE) 20 MG tablet 3 tabs po day one, then 2 tabs daily x 4 days 12/15/23   Fayrene Helper, PA-C  Pseudoeph-Doxylamine-DM-APAP (NYQUIL PO) Take 30 mLs by mouth at bedtime as needed (cough).    [provider]    Family History Family History  Problem Relation Age of Onset   Hypertension Mother    Hypertension Father     Social History Social History   Tobacco Use   Smoking status: Some Days    Current packs/day: 0.00    Average packs/day: 1 pack/day for 25.0 years (25.0 ttl pk-yrs)    Types: Cigarettes    Start date: 01/14/1995    Last attempt to quit: 01/15/2020    Years since quitting: 3.9   Smokeless tobacco: Never  Vaping Use   Vaping status: Never Used  Substance Use Topics   Alcohol use: Yes    Comment: socially   Drug use: Yes    Types: Marijuana    Comment: socially     Allergies   Patient has no known allergies.   Review of Systems Review of Systems  Constitutional:  Negative for chills  and fever.  HENT:  Negative for ear pain and sore throat.   Eyes:  Negative for pain and visual disturbance.  Respiratory:  Negative for cough and shortness of breath.   Cardiovascular:  Negative for chest pain and palpitations.  Gastrointestinal:  Negative for abdominal pain and vomiting.  Genitourinary:  Negative for dysuria and hematuria.       No menses since 09/2023  Musculoskeletal:  Negative for arthralgias and back pain.  Skin:  Negative for color change and rash.  Neurological:  Negative for seizures and syncope.  All other systems reviewed and are negative.    Physical Exam Triage Vital Signs ED Triage Vitals [12/30/23 1810]  Encounter Vitals Group     BP 119/81     Systolic BP Percentile      Diastolic BP Percentile      Pulse Rate 98     Resp 20     Temp 97.6 F (36.4 C)     Temp Source Oral     SpO2 98 %     Weight      Height      Head Circumference      Peak  Flow      Pain Score 0     Pain Loc      Pain Education      Exclude from Growth Chart    No data found.  Updated Vital Signs BP 119/81 (BP Location: Left Arm)   Pulse 98   Temp 97.6 F (36.4 C) (Oral)   Resp 20   LMP 09/26/2023   SpO2 98%   Visual Acuity Right Eye Distance:   Left Eye Distance:   Bilateral Distance:    Right Eye Near:   Left Eye Near:    Bilateral Near:     Physical Exam Vitals and nursing note reviewed.  Constitutional:      General: She is not in acute distress.    Appearance: She is well-developed.  HENT:     Head: Normocephalic and atraumatic.  Eyes:     Conjunctiva/sclera: Conjunctivae normal.  Cardiovascular:     Rate and Rhythm: Normal rate and regular rhythm.     Heart sounds: No murmur heard. Pulmonary:     Effort: Pulmonary effort is normal. No respiratory distress.     Breath sounds: Normal breath sounds.  Abdominal:     Palpations: Abdomen is soft.     Tenderness: There is no abdominal tenderness.  Musculoskeletal:        General: No swelling.     Cervical back: Neck supple.  Skin:    General: Skin is warm and dry.     Capillary Refill: Capillary refill takes less than 2 seconds.  Neurological:     Mental Status: She is alert.  Psychiatric:        Mood and Affect: Mood normal.      UC Treatments / Results  Labs (all labs ordered are listed, but only abnormal results are displayed) Labs Reviewed  POCT URINE PREGNANCY    EKG   Radiology No results found.  Procedures Procedures (including critical care time)  Medications Ordered in UC Medications - No data to display  Initial Impression / Assessment and Plan / UC Course  I have reviewed the triage vital signs and the nursing notes.  Pertinent labs & imaging results that were available during my care of the patient were reviewed by me and considered in my medical decision making (see chart for details).  Lack of menses   Pregnancy test was negative but  as you have not had a period since 09/2023, we recommend following up with gynecology for further evaluation. We have attached the number and recommend calling on Monday to schedule an appointment.   Final Clinical Impressions(s) / UC Diagnoses   Final diagnoses:  Lack of menses     Discharge Instructions      Pregnancy test was negative but as you have not had a period since 09/2023, we recommend following up with gynecology for further evaluation. We have attached the number and recommend calling on Monday to schedule an appointment.    ED Prescriptions   None    PDMP not reviewed this encounter.   Landis Martins, [place] 12/30/23 1837

## 2023-12-30 NOTE — Discharge Instructions (Addendum)
Pregnancy test was negative but as you have not had a period since 09/2023, we recommend following up with gynecology for further evaluation. We have attached the number and recommend calling on Monday to schedule an appointment.

## 2023-12-30 NOTE — ED Triage Notes (Signed)
Patient is requesting a pregnancy test. LMP- 09/26/23

## 2024-06-29 ENCOUNTER — Other Ambulatory Visit: Payer: Self-pay

## 2024-06-29 ENCOUNTER — Emergency Department (HOSPITAL_COMMUNITY)
Admission: EM | Admit: 2024-06-29 | Discharge: 2024-06-29 | Disposition: A | Discharge status: Home / Self Care | Attending: Emergency Medicine | Admitting: Emergency Medicine

## 2024-06-29 ENCOUNTER — Encounter (HOSPITAL_COMMUNITY): Payer: Self-pay

## 2024-06-29 DIAGNOSIS — K029 Dental caries, unspecified: Secondary | ICD-10-CM | POA: Insufficient documentation

## 2024-06-29 DIAGNOSIS — K0889 Other specified disorders of teeth and supporting structures: Secondary | ICD-10-CM | POA: Diagnosis present

## 2024-06-29 DIAGNOSIS — J45909 Unspecified asthma, uncomplicated: Secondary | ICD-10-CM | POA: Diagnosis not present

## 2024-06-29 MED ORDER — ACETAMINOPHEN 500 MG PO TABS
1000.0000 mg | ORAL_TABLET | Freq: Once | ORAL | Status: AC
  Administered 2024-06-29: 1000 mg via ORAL
  Filled 2024-06-29: qty 2

## 2024-06-29 MED ORDER — AMOXICILLIN-POT CLAVULANATE 875-125 MG PO TABS
1.0000 | ORAL_TABLET | Freq: Once | ORAL | Status: AC
  Administered 2024-06-29: 1 via ORAL
  Filled 2024-06-29: qty 1

## 2024-06-29 MED ORDER — AMOXICILLIN-POT CLAVULANATE 875-125 MG PO TABS: 1.0000 | ORAL_TABLET | Freq: Two times a day (BID) | ORAL | 0 refills | Status: AC

## 2024-06-29 NOTE — ED Triage Notes (Signed)
 [facility] reports pt having dental pain for the past two months, woke up today with swelling and intense pain and earache to the right side.

## 2024-06-29 NOTE — ED Provider Notes (Signed)
 [facility] Provider Note   CSN: [id number] Arrival date & time: 06/29/24  1555     Patient presents with: Dental Pain   Rebekah GWILLIAM is a 43 y.o. female with PMHx asthma, who presents to ED concerned for dental pain x2 months. Patient stating that pain became more severe last night. Patient has not taken any medication for her pain today. Endorses a couple episodes of vomiting last night which has since resolved. Patient does not have a dentist. Patient endorses several broken teeth but believes that the pain is coming from the upper left canine.   Patient resting comfortably in bed but started crying in pain as soon as I woke her up for initial interview.     Dental Pain      Prior to Admission medications   Medication Sig Start Date End Date Taking? Authorizing Provider  albuterol  (VENTOLIN  HFA) 108 (90 Base) MCG/ACT inhaler Inhale 2 puffs into the lungs every 2 (two) hours as needed for wheezing or shortness of breath. 06/11/23   Tat, Alm, MD  azithromycin  (ZITHROMAX ) 250 MG tablet Take 1 tablet (250 mg total) by mouth daily. 12/15/23   Nivia Colon, PA-C  predniSONE  (DELTASONE ) 20 MG tablet 3 tabs po day one, then 2 tabs daily x 4 days 12/15/23   Nivia Colon, PA-C  Pseudoeph-Doxylamine-DM-APAP (NYQUIL PO) Take 30 mLs by mouth at bedtime as needed (cough).    [provider]    Allergies: Patient has no known allergies.    Review of Systems  HENT:  Positive for dental problem.     Updated Vital Signs BP (!) 157/104 (BP Location: Right Arm)   Pulse 90   Temp (!) 97.5 F (36.4 C) (Axillary)   Resp 18   SpO2 96%   Physical Exam Vitals and nursing note reviewed.  Constitutional:      General: She is not in acute distress.    Appearance: She is not ill-appearing or toxic-appearing.  HENT:     Head: Normocephalic and atraumatic.     Mouth/Throat:     Comments: Multiple broken teeth and carries throughout  oropharynx. No lesions or swelling or erythema appreciated.  Eyes:     General: No scleral icterus.       Right eye: No discharge.        Left eye: No discharge.     Conjunctiva/sclera: Conjunctivae normal.  Cardiovascular:     Rate and Rhythm: Normal rate.  Pulmonary:     Effort: Pulmonary effort is normal.  Abdominal:     General: Abdomen is flat.  Skin:    General: Skin is warm and dry.  Neurological:     General: No focal deficit present.     Mental Status: She is alert. Mental status is at baseline.  Psychiatric:        Mood and Affect: Mood normal.        Behavior: Behavior normal.     (all labs ordered are listed, but only abnormal results are displayed) Labs Reviewed - No data to display  EKG: None  Radiology: No results found.   Procedures   Medications Ordered in the ED - No data to display                                  Medical Decision Making   This patient presents to the ED for concern of  dental pain, this involves an extensive number of treatment options, and is a complaint that carries with it a high risk of complications and morbidity.  The differential diagnosis includes deep space infection   Co morbidities that complicate the patient evaluation  asthma   Additional history obtained:  No PCP listed in chart   Problem List / ED Course / Critical interventions / Medication management  Patient presents to ED concerned for dental pain x2 months worsening yesterday.  Physical exam showing multiple carries and broken teeth. Rest of physical exam reassuring. Patient afebrile with stable vitals. Provided patient with a dose of Augmentin in ED and with discharge with a 7 day course of Augmentin. Educated patient on alternating Advil  and Tylenol  for pain management. Provided patient with list for dentist in the area. Educated patient that symptoms will return and may become more complicated if dentist follow up is not obtained. Patient verbally  endorsed understanding of plan. I have reviewed the patients home medicines and have made adjustments as needed The patient has been appropriately medically screened and/or stabilized in the ED. I have low suspicion for any other emergent medical condition which would require further screening, evaluation or treatment in the ED or require inpatient management. At time of discharge the patient is hemodynamically stable and in no acute distress. I have discussed work-up results and diagnosis with patient and answered all questions. Patient is agreeable with discharge plan. We discussed strict return precautions for returning to the emergency department and they verbalized understanding.     Social Determinants of Health:  none       Final diagnoses:  Pain, dental    ED Discharge Orders     None          Hoy Nidia FALCON, [place] 06/29/24 1650    Mannie Pac T, DO 06/29/24 2323

## 2024-06-29 NOTE — Discharge Instructions (Addendum)
 You will need to follow up with a dentist in the next 48 hours. Personally, I have been able to get quick appointments with [facility] here in [place]. Seek emergency care if experiencing any new or worsening symptoms.   Alternating between 650 mg Tylenol  and 400 mg Advil : The best way to alternate taking Acetaminophen  (example Tylenol ) and Ibuprofen  (example Advil /Motrin ) is to take them 3 hours apart. For example, if you take ibuprofen  at 6 am you can then take Tylenol  at 9 am. You can continue this regimen throughout the day, making sure you do not exceed the recommended maximum dose for each drug.
